# Patient Record
Sex: Female | Born: 1975 | Race: White | Hispanic: No | Marital: Married | Smoking: Never smoker
Health system: Southern US, Community
[De-identification: ages and names within clinical notes are randomized; demographics above are authoritative.]

## PROBLEM LIST (undated history)

## (undated) DIAGNOSIS — I10 Essential (primary) hypertension: Secondary | ICD-10-CM

## (undated) DIAGNOSIS — Z789 Other specified health status: Secondary | ICD-10-CM

## (undated) DIAGNOSIS — F32A Depression, unspecified: Secondary | ICD-10-CM

## (undated) DIAGNOSIS — K219 Gastro-esophageal reflux disease without esophagitis: Secondary | ICD-10-CM

## (undated) DIAGNOSIS — F419 Anxiety disorder, unspecified: Secondary | ICD-10-CM

## (undated) DIAGNOSIS — M549 Dorsalgia, unspecified: Secondary | ICD-10-CM

## (undated) HISTORY — DX: Essential (primary) hypertension: I10

## (undated) HISTORY — DX: Anxiety disorder, unspecified: F41.9

## (undated) HISTORY — DX: Gastro-esophageal reflux disease without esophagitis: K21.9

## (undated) HISTORY — DX: Dorsalgia, unspecified: M54.9

## (undated) HISTORY — DX: Depression, unspecified: F32.A

## (undated) HISTORY — PX: BREAST SURGERY: SHX581

## (undated) HISTORY — PX: DILATION AND CURETTAGE OF UTERUS: SHX78

---

## 2010-01-06 ENCOUNTER — Ambulatory Visit (HOSPITAL_COMMUNITY): Admission: RE | Admit: 2010-01-06 | Discharge: 2010-01-06 | Payer: Self-pay | Admitting: Obstetrics and Gynecology

## 2010-01-07 ENCOUNTER — Ambulatory Visit (HOSPITAL_COMMUNITY)
Admission: RE | Admit: 2010-01-07 | Discharge: 2010-01-07 | Payer: Self-pay | Source: Home / Self Care | Admitting: Obstetrics and Gynecology

## 2010-04-05 NOTE — L&D Delivery Note (Signed)
Second Stage complicated by rather significant decels with contractions and limited recovery so VE applied at +3/OA and delivery facilitated.  Delivered an 8 pound female with Apgars of 9/9 over a second degree tear.  Nuchal cord X 1, easily reduced.  Private Cord Blood Donor (patient request).  Placenta delivered intact.  2A/1V.  Second degree tear and several small lateral tears repaired with 3-0 Rapide and 3-0 Vicryl.  Breast feed.

## 2010-04-27 ENCOUNTER — Other Ambulatory Visit: Payer: Self-pay | Admitting: Obstetrics and Gynecology

## 2010-05-28 LAB — HIV ANTIBODY (ROUTINE TESTING W REFLEX): HIV: NONREACTIVE

## 2010-05-28 LAB — HEPATITIS B SURFACE ANTIGEN: Hepatitis B Surface Ag: NEGATIVE

## 2010-05-28 LAB — ANTIBODY SCREEN: Antibody Screen: NEGATIVE

## 2010-06-18 LAB — CBC
MCV: 92.8 fL (ref 78.0–100.0)
Platelets: 257 10*3/uL (ref 150–400)
RDW: 12.9 % (ref 11.5–15.5)
WBC: 9.5 10*3/uL (ref 4.0–10.5)

## 2010-06-18 LAB — ABO/RH: ABO/RH(D): O POS

## 2010-12-16 ENCOUNTER — Inpatient Hospital Stay (HOSPITAL_COMMUNITY): Payer: Managed Care, Other (non HMO) | Admitting: Anesthesiology

## 2010-12-16 ENCOUNTER — Encounter (HOSPITAL_COMMUNITY): Payer: Self-pay | Admitting: *Deleted

## 2010-12-16 ENCOUNTER — Inpatient Hospital Stay (HOSPITAL_COMMUNITY)
Admission: AD | Admit: 2010-12-16 | Discharge: 2010-12-18 | DRG: 775 | Disposition: A | Discharge status: Home / Self Care | Payer: Managed Care, Other (non HMO) | Source: Ambulatory Visit | Attending: Obstetrics & Gynecology | Admitting: Obstetrics & Gynecology

## 2010-12-16 ENCOUNTER — Encounter (HOSPITAL_COMMUNITY): Payer: Self-pay | Admitting: Anesthesiology

## 2010-12-16 DIAGNOSIS — Z3480 Encounter for supervision of other normal pregnancy, unspecified trimester: Secondary | ICD-10-CM

## 2010-12-16 HISTORY — DX: Other specified health status: Z78.9

## 2010-12-16 LAB — CBC
HCT: 33.2 % — ABNORMAL LOW (ref 36.0–46.0)
Hemoglobin: 11 g/dL — ABNORMAL LOW (ref 12.0–15.0)
MCHC: 33.1 g/dL (ref 30.0–36.0)
RDW: 14.2 % (ref 11.5–15.5)
WBC: 11.6 10*3/uL — ABNORMAL HIGH (ref 4.0–10.5)

## 2010-12-16 LAB — RPR: RPR Ser Ql: NONREACTIVE

## 2010-12-16 MED ORDER — SENNOSIDES-DOCUSATE SODIUM 8.6-50 MG PO TABS
2.0000 | ORAL_TABLET | Freq: Every day | ORAL | Status: DC
  Administered 2010-12-17: 2 via ORAL

## 2010-12-16 MED ORDER — INFLUENZA VIRUS VACC SPLIT PF IM SUSP
0.5000 mL | Freq: Once | INTRAMUSCULAR | Status: AC
  Administered 2010-12-18: 0.5 mL via INTRAMUSCULAR
  Filled 2010-12-16: qty 0.5

## 2010-12-16 MED ORDER — OXYTOCIN 20 UNITS IN LACTATED RINGERS INFUSION - SIMPLE
125.0000 mL/h | Freq: Once | INTRAVENOUS | Status: AC
  Administered 2010-12-16: 1 mL/h via INTRAVENOUS

## 2010-12-16 MED ORDER — OXYCODONE-ACETAMINOPHEN 5-325 MG PO TABS
1.0000 | ORAL_TABLET | ORAL | Status: DC | PRN
  Administered 2010-12-17 (×3): 1 via ORAL
  Administered 2010-12-18: 2 via ORAL
  Administered 2010-12-18: 1 via ORAL
  Filled 2010-12-16: qty 2

## 2010-12-16 MED ORDER — OXYTOCIN BOLUS FROM INFUSION
500.0000 mL | Freq: Once | INTRAVENOUS | Status: DC
  Filled 2010-12-16: qty 500

## 2010-12-16 MED ORDER — LIDOCAINE HCL (PF) 1 % IJ SOLN
30.0000 mL | INTRAMUSCULAR | Status: DC | PRN
  Filled 2010-12-16 (×3): qty 30

## 2010-12-16 MED ORDER — ACETAMINOPHEN 325 MG PO TABS: 650.0000 mg | ORAL_TABLET | ORAL | Status: DC | PRN

## 2010-12-16 MED ORDER — SIMETHICONE 80 MG PO CHEW: 80.0000 mg | CHEWABLE_TABLET | ORAL | Status: DC | PRN

## 2010-12-16 MED ORDER — ONDANSETRON HCL 4 MG PO TABS: 4.0000 mg | ORAL_TABLET | ORAL | Status: DC | PRN

## 2010-12-16 MED ORDER — DIBUCAINE 1 % RE OINT: 1.0000 "application " | TOPICAL_OINTMENT | RECTAL | Status: DC | PRN

## 2010-12-16 MED ORDER — PHENYLEPHRINE 40 MCG/ML (10ML) SYRINGE FOR IV PUSH (FOR BLOOD PRESSURE SUPPORT)
80.0000 ug | PREFILLED_SYRINGE | INTRAVENOUS | Status: DC | PRN
  Filled 2010-12-16 (×2): qty 5

## 2010-12-16 MED ORDER — IBUPROFEN 600 MG PO TABS
600.0000 mg | ORAL_TABLET | Freq: Four times a day (QID) | ORAL | Status: DC | PRN
  Filled 2010-12-16 (×2): qty 1

## 2010-12-16 MED ORDER — ONDANSETRON HCL 4 MG/2ML IJ SOLN: 4.0000 mg | Freq: Four times a day (QID) | INTRAMUSCULAR | Status: DC | PRN

## 2010-12-16 MED ORDER — DIPHENHYDRAMINE HCL 25 MG PO CAPS: 25.0000 mg | ORAL_CAPSULE | Freq: Four times a day (QID) | ORAL | Status: DC | PRN

## 2010-12-16 MED ORDER — BUTORPHANOL TARTRATE 2 MG/ML IJ SOLN: 1.0000 mg | INTRAMUSCULAR | Status: DC | PRN

## 2010-12-16 MED ORDER — BENZOCAINE-MENTHOL 20-0.5 % EX AERO: 1.0000 "application " | INHALATION_SPRAY | CUTANEOUS | Status: DC | PRN

## 2010-12-16 MED ORDER — OXYCODONE-ACETAMINOPHEN 5-325 MG PO TABS: 2.0000 | ORAL_TABLET | ORAL | Status: DC | PRN

## 2010-12-16 MED ORDER — LIDOCAINE HCL (PF) 1 % IJ SOLN: 30.0000 mL | INTRAMUSCULAR | Status: DC | PRN

## 2010-12-16 MED ORDER — ZOLPIDEM TARTRATE 5 MG PO TABS: 5.0000 mg | ORAL_TABLET | Freq: Every evening | ORAL | Status: DC | PRN

## 2010-12-16 MED ORDER — TERBUTALINE SULFATE 1 MG/ML IJ SOLN: 0.2500 mg | Freq: Once | INTRAMUSCULAR | Status: AC | PRN

## 2010-12-16 MED ORDER — LACTATED RINGERS IV SOLN: 500.0000 mL | Freq: Once | INTRAVENOUS | Status: DC

## 2010-12-16 MED ORDER — OXYTOCIN 20 UNITS IN LACTATED RINGERS INFUSION - SIMPLE: 125.0000 mL/h | Freq: Once | INTRAVENOUS | Status: DC

## 2010-12-16 MED ORDER — CITRIC ACID-SODIUM CITRATE 334-500 MG/5ML PO SOLN: 30.0000 mL | ORAL | Status: DC | PRN

## 2010-12-16 MED ORDER — PRENATAL PLUS 27-1 MG PO TABS
1.0000 | ORAL_TABLET | Freq: Every day | ORAL | Status: DC
  Administered 2010-12-17 – 2010-12-18 (×2): 1 via ORAL
  Filled 2010-12-16 (×2): qty 1

## 2010-12-16 MED ORDER — IBUPROFEN 600 MG PO TABS: 600.0000 mg | ORAL_TABLET | Freq: Four times a day (QID) | ORAL | Status: DC | PRN

## 2010-12-16 MED ORDER — OXYTOCIN 20 UNITS IN LACTATED RINGERS INFUSION - SIMPLE
1.0000 m[IU]/min | INTRAVENOUS | Status: DC
Start: 2010-12-16 — End: 2010-12-18
  Administered 2010-12-16: 6 m[IU]/min via INTRAVENOUS

## 2010-12-16 MED ORDER — TETANUS-DIPHTH-ACELL PERTUSSIS 5-2.5-18.5 LF-MCG/0.5 IM SUSP
0.5000 mL | Freq: Once | INTRAMUSCULAR | Status: AC
  Administered 2010-12-17: 0.5 mL via INTRAMUSCULAR

## 2010-12-16 MED ORDER — EPHEDRINE 5 MG/ML INJ
10.0000 mg | INTRAVENOUS | Status: DC | PRN
  Filled 2010-12-16 (×2): qty 4

## 2010-12-16 MED ORDER — ONDANSETRON HCL 4 MG/2ML IJ SOLN: 4.0000 mg | INTRAMUSCULAR | Status: DC | PRN

## 2010-12-16 MED ORDER — LACTATED RINGERS IV SOLN: 500.0000 mL | INTRAVENOUS | Status: DC | PRN

## 2010-12-16 MED ORDER — LACTATED RINGERS IV SOLN
INTRAVENOUS | Status: DC
  Administered 2010-12-16 (×2): via INTRAVENOUS

## 2010-12-16 MED ORDER — FENTANYL 2.5 MCG/ML BUPIVACAINE 1/10 % EPIDURAL INFUSION (WH - ANES)
14.0000 mL/h | INTRAMUSCULAR | Status: DC
  Administered 2010-12-16 (×2): 14 mL/h via EPIDURAL
  Filled 2010-12-16 (×2): qty 60

## 2010-12-16 MED ORDER — FLEET ENEMA 7-19 GM/118ML RE ENEM: 1.0000 | ENEMA | RECTAL | Status: DC | PRN

## 2010-12-16 MED ORDER — LACTATED RINGERS IV SOLN: INTRAVENOUS | Status: DC

## 2010-12-16 MED ORDER — PHENYLEPHRINE 40 MCG/ML (10ML) SYRINGE FOR IV PUSH (FOR BLOOD PRESSURE SUPPORT)
80.0000 ug | PREFILLED_SYRINGE | INTRAVENOUS | Status: DC | PRN
  Filled 2010-12-16: qty 5

## 2010-12-16 MED ORDER — DIPHENHYDRAMINE HCL 50 MG/ML IJ SOLN: 12.5000 mg | INTRAMUSCULAR | Status: DC | PRN

## 2010-12-16 MED ORDER — LANOLIN HYDROUS EX OINT: TOPICAL_OINTMENT | CUTANEOUS | Status: DC | PRN

## 2010-12-16 MED ORDER — EPHEDRINE 5 MG/ML INJ
10.0000 mg | INTRAVENOUS | Status: DC | PRN
  Filled 2010-12-16: qty 4

## 2010-12-16 MED ORDER — LACTATED RINGERS IV SOLN
500.0000 mL | INTRAVENOUS | Status: DC | PRN
  Administered 2010-12-16 (×3): 1000 mL via INTRAVENOUS

## 2010-12-16 MED ORDER — LIDOCAINE HCL 1.5 % IJ SOLN
INTRAMUSCULAR | Status: DC | PRN
  Administered 2010-12-16 (×2): 5 mL via EPIDURAL
  Administered 2010-12-16: 2 mL via EPIDURAL

## 2010-12-16 MED ORDER — OXYTOCIN BOLUS FROM INFUSION
500.0000 mL | Freq: Once | INTRAVENOUS | Status: AC
  Administered 2010-12-16: 500 mL via INTRAVENOUS
  Filled 2010-12-16: qty 500
  Filled 2010-12-16: qty 1000

## 2010-12-16 MED ORDER — IBUPROFEN 600 MG PO TABS
600.0000 mg | ORAL_TABLET | Freq: Four times a day (QID) | ORAL | Status: DC
  Administered 2010-12-17 – 2010-12-18 (×6): 600 mg via ORAL
  Filled 2010-12-16 (×4): qty 1

## 2010-12-16 MED ORDER — OXYCODONE-ACETAMINOPHEN 5-325 MG PO TABS
2.0000 | ORAL_TABLET | ORAL | Status: DC | PRN
  Filled 2010-12-16 (×4): qty 1

## 2010-12-16 MED ORDER — WITCH HAZEL-GLYCERIN EX PADS: 1.0000 "application " | MEDICATED_PAD | CUTANEOUS | Status: DC | PRN

## 2010-12-16 NOTE — Progress Notes (Signed)
Pt up to ambulate at this time.

## 2010-12-16 NOTE — Progress Notes (Signed)
30- dr. Aldona Bar viewed strip. Aware that pitocin has been dc'd.

## 2010-12-16 NOTE — Progress Notes (Signed)
42- dr. Aldona Bar called concerning decels. Notified of pt's sve and position change.

## 2010-12-16 NOTE — Anesthesia Procedure Notes (Signed)
Epidural Patient location during procedure: OB Start time: 12/16/2010 2:46 PM Reason for block: procedure for pain  Staffing Performed by: anesthesiologist   Preanesthetic Checklist Completed: patient identified, site marked, surgical consent, pre-op evaluation, timeout performed, IV checked, risks and benefits discussed and monitors and equipment checked  Epidural Patient position: sitting Prep: site prepped and draped and DuraPrep Patient monitoring: continuous pulse ox and blood pressure Approach: midline Injection technique: LOR air  Needle:  Needle type: Tuohy  Needle gauge: 17 G Needle length: 9 cm Needle insertion depth: 4 cm Catheter type: closed end flexible Catheter size: 19 Gauge Catheter at skin depth: 9 cm Test dose: negative  Assessment Events: blood not aspirated, injection not painful, no injection resistance, negative IV test and no paresthesia

## 2010-12-16 NOTE — Progress Notes (Signed)
FH reassuring/reactive.  Comfortable with epidural.  Cx: 4/80/vtx-2-1 and sl posterior.  AROM--clear fluid.  Hopefully this will result in progress.Marland KitchenMarland Kitchen

## 2010-12-16 NOTE — Progress Notes (Signed)
Pt G2 P1 at 40.3 having contractions.  Reports bloody show, denies any problems with pregnancy.

## 2010-12-16 NOTE — Progress Notes (Signed)
Spaced out contractions; FH reactive.  Although orders for [facility] Aug, etc put in at 8:30AM, L&D unaware.  Recommunicated with patient's nurse.  Pit aug to be begun soon.

## 2010-12-16 NOTE — Progress Notes (Signed)
Head compression decelerations noted with better contraction pattern.  Nurse has placed patient on oxygen.  In between contractions, FH reassuring.  Cx now 5/90/ more anterior and probably down to -1 to 0 station.  Suspect she may quickly progress now.

## 2010-12-16 NOTE — Progress Notes (Signed)
Dr. Arlyce Dice notified of pt presenting of labor check. Notified of ctx pattern.  Orders to check pt.  Call back if in active labor, if not pt may walk and be rechecked after one hour.

## 2010-12-16 NOTE — Progress Notes (Signed)
Nice progress--now 6-7/90+/vtx 0 +1.  Continues with head compression decels and patient is feeling pressure with contractions.

## 2010-12-16 NOTE — H&P (Signed)
35 year old G3P1A1 at 40+ weeks gestation with benign pregnancy in early labor.  -GBS.    PE:  VS stable Abd:  FH reactive.  EFW about 7-8 pounds. CX:  2+ still slightly posterior/Vtx -2  IMP:  Term IUP Plan:  Admit/ Augment with Pit (discussed with patient and husband)/expectant management

## 2010-12-16 NOTE — Progress Notes (Signed)
1930- pt c/o urge to push with uc's.

## 2010-12-16 NOTE — Anesthesia Preprocedure Evaluation (Signed)
Anesthesia Evaluation  Name, MR# and DOB Patient awake  General Assessment Comment  Reviewed: Allergy & Precautions, H&P , NPO status , Patient's Chart, lab work & pertinent test results, reviewed documented beta blocker date and time   History of Anesthesia Complications Negative for: history of anesthetic complications  Airway Mallampati: I TM Distance: >3 FB Neck ROM: full    Dental  (+) Teeth Intact   Pulmonary  clear to auscultation  breath sounds clear to auscultation none    Cardiovascular regular Normal    Neuro/Psych Negative Neurological ROS  Negative Psych ROS  GI/Hepatic/Renal negative GI ROS  negative Liver ROS  negative Renal ROS        Endo/Other  Negative Endocrine ROS (+)      Abdominal   Musculoskeletal   Hematology negative hematology ROS (+)   Peds  Reproductive/Obstetrics (+) Pregnancy    Anesthesia Other Findings             Anesthesia Physical Anesthesia Plan  ASA: II  Anesthesia Plan: Epidural   Post-op Pain Management:    Induction:   Airway Management Planned:   Additional Equipment:   Intra-op Plan:   Post-operative Plan:   Informed Consent: I have reviewed the patients History and Physical, chart, labs and discussed the procedure including the risks, benefits and alternatives for the proposed anesthesia with the patient or authorized representative who has indicated his/her understanding and acceptance.     Plan Discussed with:   Anesthesia Plan Comments:         Anesthesia Quick Evaluation  

## 2010-12-16 NOTE — Progress Notes (Signed)
Notified of sve. Order to admit  To L&D

## 2010-12-17 LAB — CBC
Platelets: 208 10*3/uL (ref 150–400)
RBC: 3.54 MIL/uL — ABNORMAL LOW (ref 3.87–5.11)
WBC: 13.2 10*3/uL — ABNORMAL HIGH (ref 4.0–10.5)

## 2010-12-17 NOTE — Progress Notes (Addendum)
Patient is eating, ambulating, voiding.  Pain control is good.  Filed Vitals:   12/16/10 2300 12/17/10 0026 12/17/10 0438 12/17/10 0645  BP: 124/69 109/67 120/71 125/78  Pulse: 63 67 62 71  Temp: 97.8 F (36.6 C) 98.2 F (36.8 C) 98 F (36.7 C) 97.7 F (36.5 C)  TempSrc: Oral  Oral Oral  Resp: 18 14  16   Height:      Weight:      SpO2:  98% 97%     Fundus firm Perineum without swelling.  Lab Results  Component Value Date   WBC 13.2* 12/17/2010   HGB 10.5* 12/17/2010   HCT 31.7* 12/17/2010   MCV 89.5 12/17/2010   PLT 208 12/17/2010    O/Positive/-- (02/23 0000)  A/P Post partum day 1.  Routine care.  Expect d/c per plan.   Parents desires circumsision.  All risks, benefits and alternatives discussed with the mother.   Brette Cast A

## 2010-12-18 MED ORDER — IBUPROFEN 600 MG PO TABS: 600.0000 mg | ORAL_TABLET | Freq: Four times a day (QID) | ORAL | Status: AC | PRN

## 2010-12-18 MED ORDER — OXYCODONE-ACETAMINOPHEN 5-325 MG PO TABS: 1.0000 | ORAL_TABLET | ORAL | Status: AC | PRN

## 2010-12-18 NOTE — Discharge Summary (Signed)
Obstetric Discharge Summary Reason for Admission: onset of labor Prenatal Procedures: ultrasound Intrapartum Procedures: spontaneous vaginal delivery Postpartum Procedures: none Complications-Operative and Postpartum: none Hemoglobin  Date Value Range Status  12/17/2010 10.5* 12.0-15.0 (g/dL) Final     HCT  Date Value Range Status  12/17/2010 31.7* 36.0-46.0 (%) Final    Discharge Diagnoses: Term Pregnancy-delivered  Discharge Information: Date: 12/18/2010 Activity: unrestricted Diet: routine Medications: PNV, Ibuprophen and Percocet Condition: stable Instructions: refer to practice specific booklet Discharge to: home Follow-up Information    Follow up with Kara Hodge,Kara Hodge in 4 weeks.   Contact information:   [address] [place] [address] [phone]          Newborn Data: Live born female  Birth Weight: 8 lb 0.6 oz (3646 g) APGAR: 9, 9  Home with mother.  Kara Hodge 12/18/2010, 7:57 AM

## 2010-12-18 NOTE — Progress Notes (Signed)
PPD#2 Pt without c/o. VVSAF IMP/Doing well PLAN/ D/C to home.

## 2010-12-18 NOTE — Anesthesia Postprocedure Evaluation (Signed)
Patient stable following vaginal delivery.  

## 2010-12-18 NOTE — Addendum Note (Signed)
Addendum  created 12/18/10 0809 by Stevee Valenta L. Rodman Pickle   Modules edited:Notes Section

## 2010-12-19 ENCOUNTER — Inpatient Hospital Stay (HOSPITAL_COMMUNITY): Admission: RE | Admit: 2010-12-19 | Payer: Self-pay | Source: Ambulatory Visit

## 2010-12-23 NOTE — Progress Notes (Signed)
Encounter addended by: Elaina Hoops, RN on: 12/23/2010 10:21 PM<BR>     Documentation filed: Charges VN

## 2011-06-28 ENCOUNTER — Ambulatory Visit (HOSPITAL_BASED_OUTPATIENT_CLINIC_OR_DEPARTMENT_OTHER)
Admission: RE | Admit: 2011-06-28 | Discharge: 2011-06-28 | Disposition: A | Discharge status: Home / Self Care | Payer: Managed Care, Other (non HMO) | Source: Ambulatory Visit | Attending: Family Medicine | Admitting: Family Medicine

## 2011-06-28 ENCOUNTER — Other Ambulatory Visit (HOSPITAL_BASED_OUTPATIENT_CLINIC_OR_DEPARTMENT_OTHER): Payer: Self-pay | Admitting: Family Medicine

## 2011-06-28 DIAGNOSIS — S6992XA Unspecified injury of left wrist, hand and finger(s), initial encounter: Secondary | ICD-10-CM

## 2011-06-28 DIAGNOSIS — S62639A Displaced fracture of distal phalanx of unspecified finger, initial encounter for closed fracture: Secondary | ICD-10-CM | POA: Insufficient documentation

## 2011-06-28 DIAGNOSIS — X58XXXA Exposure to other specified factors, initial encounter: Secondary | ICD-10-CM | POA: Insufficient documentation

## 2011-08-03 ENCOUNTER — Other Ambulatory Visit (HOSPITAL_BASED_OUTPATIENT_CLINIC_OR_DEPARTMENT_OTHER): Payer: Self-pay | Admitting: Family Medicine

## 2011-08-03 ENCOUNTER — Ambulatory Visit (HOSPITAL_BASED_OUTPATIENT_CLINIC_OR_DEPARTMENT_OTHER)
Admission: RE | Admit: 2011-08-03 | Discharge: 2011-08-03 | Disposition: A | Discharge status: Home / Self Care | Payer: Managed Care, Other (non HMO) | Source: Ambulatory Visit | Attending: Family Medicine | Admitting: Family Medicine

## 2011-08-03 DIAGNOSIS — S62639A Displaced fracture of distal phalanx of unspecified finger, initial encounter for closed fracture: Secondary | ICD-10-CM | POA: Insufficient documentation

## 2011-08-03 DIAGNOSIS — T148XXA Other injury of unspecified body region, initial encounter: Secondary | ICD-10-CM

## 2011-08-03 DIAGNOSIS — Z09 Encounter for follow-up examination after completed treatment for conditions other than malignant neoplasm: Secondary | ICD-10-CM

## 2011-08-03 DIAGNOSIS — X58XXXA Exposure to other specified factors, initial encounter: Secondary | ICD-10-CM | POA: Insufficient documentation

## 2011-09-23 ENCOUNTER — Other Ambulatory Visit (HOSPITAL_BASED_OUTPATIENT_CLINIC_OR_DEPARTMENT_OTHER): Payer: Self-pay | Admitting: Family Medicine

## 2011-09-23 ENCOUNTER — Ambulatory Visit (HOSPITAL_BASED_OUTPATIENT_CLINIC_OR_DEPARTMENT_OTHER)
Admission: RE | Admit: 2011-09-23 | Discharge: 2011-09-23 | Disposition: A | Discharge status: Home / Self Care | Payer: Managed Care, Other (non HMO) | Source: Ambulatory Visit | Attending: Family Medicine | Admitting: Family Medicine

## 2011-09-23 DIAGNOSIS — M25549 Pain in joints of unspecified hand: Secondary | ICD-10-CM | POA: Insufficient documentation

## 2012-02-17 ENCOUNTER — Other Ambulatory Visit: Payer: Self-pay | Admitting: Obstetrics and Gynecology

## 2013-03-19 ENCOUNTER — Other Ambulatory Visit: Payer: Self-pay | Admitting: Obstetrics and Gynecology

## 2013-12-15 DIAGNOSIS — G4733 Obstructive sleep apnea (adult) (pediatric): Secondary | ICD-10-CM | POA: Insufficient documentation

## 2013-12-15 DIAGNOSIS — E038 Other specified hypothyroidism: Secondary | ICD-10-CM | POA: Insufficient documentation

## 2014-02-04 ENCOUNTER — Encounter (HOSPITAL_COMMUNITY): Payer: Self-pay | Admitting: *Deleted

## 2014-03-20 ENCOUNTER — Other Ambulatory Visit: Payer: Self-pay | Admitting: Obstetrics and Gynecology

## 2014-03-22 LAB — CYTOLOGY - PAP

## 2015-01-12 DIAGNOSIS — E66811 Obesity, class 1: Secondary | ICD-10-CM | POA: Insufficient documentation

## 2015-01-12 DIAGNOSIS — E669 Obesity, unspecified: Secondary | ICD-10-CM | POA: Insufficient documentation

## 2015-01-12 DIAGNOSIS — R632 Polyphagia: Secondary | ICD-10-CM | POA: Insufficient documentation

## 2015-04-08 ENCOUNTER — Other Ambulatory Visit: Payer: Self-pay | Admitting: Obstetrics and Gynecology

## 2015-04-09 LAB — CYTOLOGY - PAP

## 2016-01-07 ENCOUNTER — Other Ambulatory Visit: Payer: Self-pay | Admitting: Obstetrics and Gynecology

## 2016-01-07 DIAGNOSIS — N63 Unspecified lump in unspecified breast: Secondary | ICD-10-CM

## 2016-01-14 ENCOUNTER — Other Ambulatory Visit: Payer: Self-pay | Admitting: Obstetrics and Gynecology

## 2016-01-14 ENCOUNTER — Ambulatory Visit
Admission: RE | Admit: 2016-01-14 | Discharge: 2016-01-14 | Disposition: A | Discharge status: Home / Self Care | Payer: Managed Care, Other (non HMO) | Source: Ambulatory Visit | Attending: Obstetrics and Gynecology | Admitting: Obstetrics and Gynecology

## 2016-01-14 DIAGNOSIS — N63 Unspecified lump in unspecified breast: Secondary | ICD-10-CM

## 2016-04-12 ENCOUNTER — Other Ambulatory Visit: Payer: Self-pay | Admitting: Obstetrics and Gynecology

## 2016-04-13 LAB — CYTOLOGY - PAP

## 2017-12-09 DIAGNOSIS — F32A Depression, unspecified: Secondary | ICD-10-CM | POA: Insufficient documentation

## 2017-12-09 DIAGNOSIS — F329 Major depressive disorder, single episode, unspecified: Secondary | ICD-10-CM | POA: Insufficient documentation

## 2018-02-22 IMAGING — MG 2D DIGITAL DIAGNOSTIC BILATERAL MAMMOGRAM WITH IMPLANTS, CAD AND
8 of 19 series · 8 of 39 positions shown · non-contrast
Comparison: No prior exams.

CLINICAL DATA: Patient describes a palpable lump within the inner
left breast. Patient also describes diffuse pain within the
upper-outer quadrant of the left breast and nipple sensitivity.

EXAM:
2D DIGITAL DIAGNOSTIC BILATERAL MAMMOGRAM WITH IMPLANTS, CAD AND
ADJUNCT TOMO
ULTRASOUND LEFT BREAST
The patient has retropectoral saline implants. Standard and implant
displaced views were performed.

[L MLO]
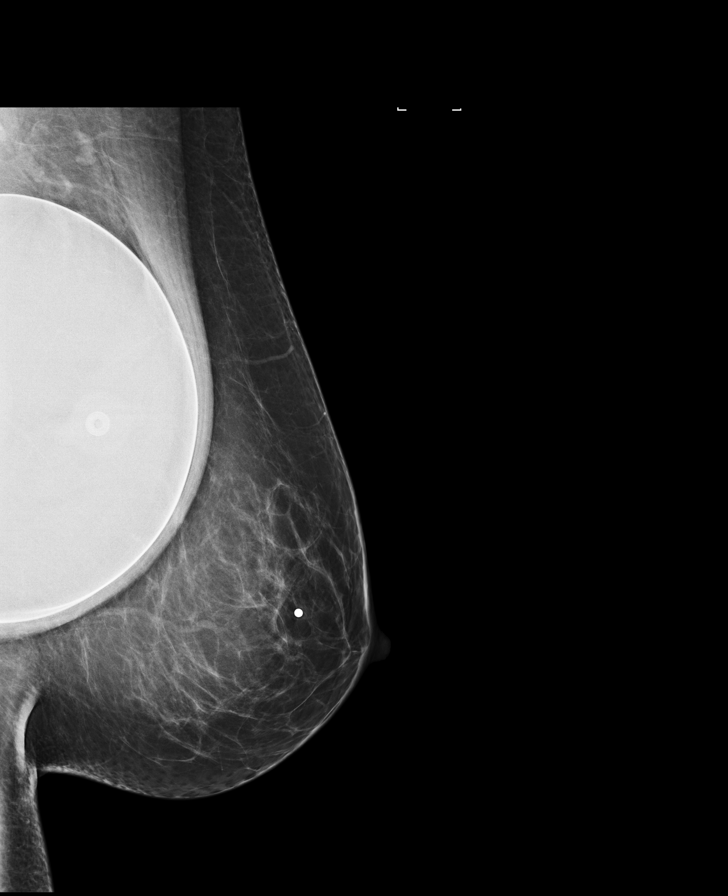

[R CC (1 of 2)]
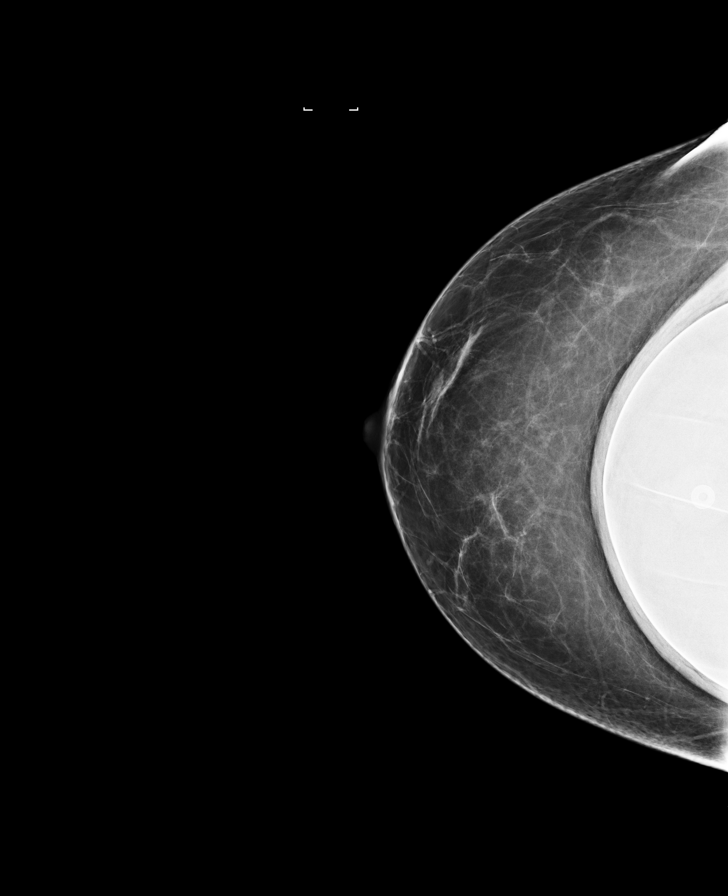

[L CC (1 of 2)]
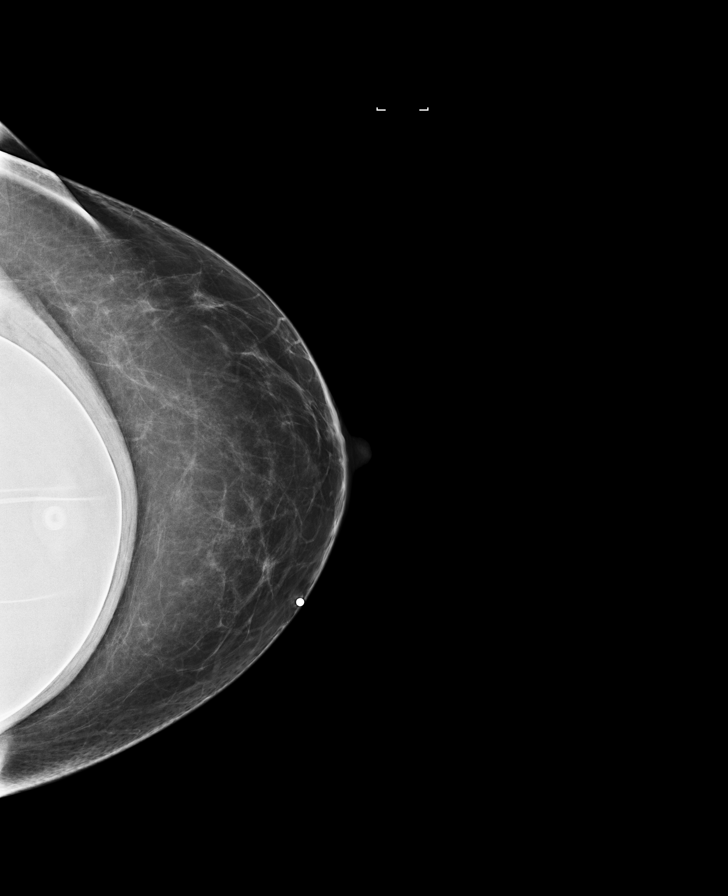

[R MLO]
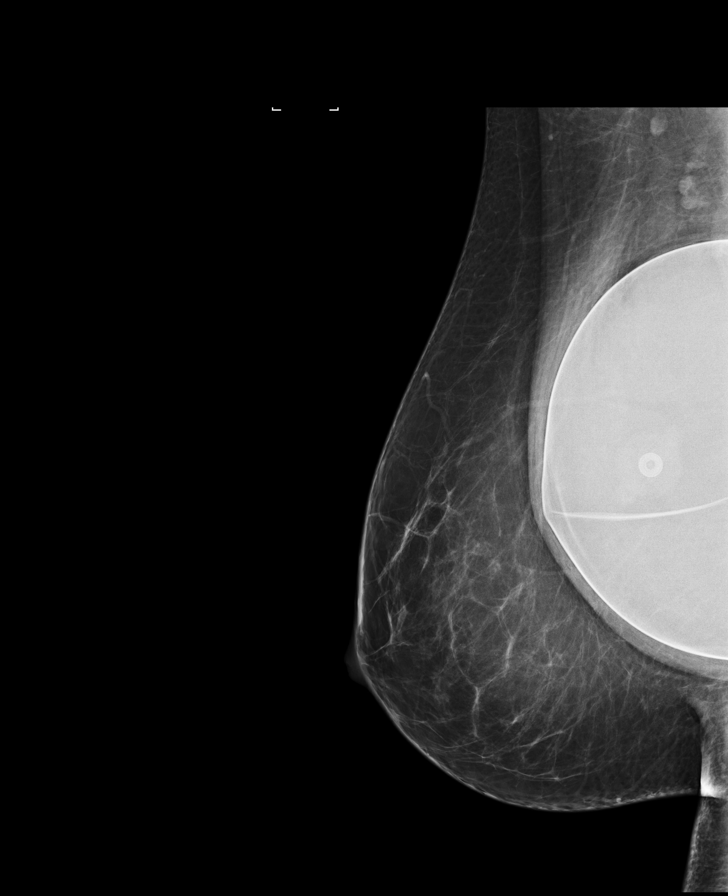

[R CC (2 of 2)]
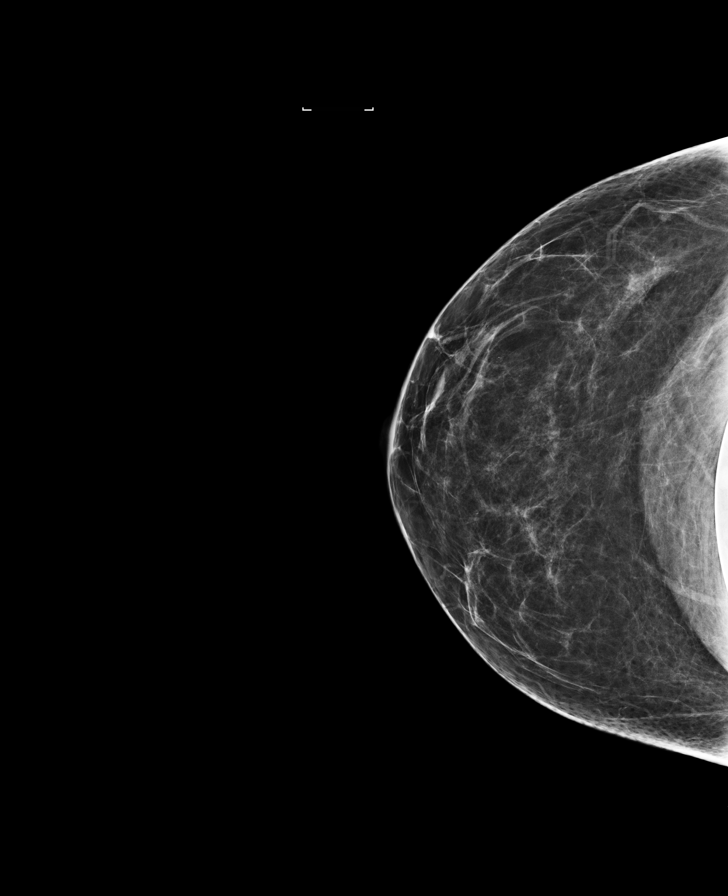

[L MLO synth-2D]
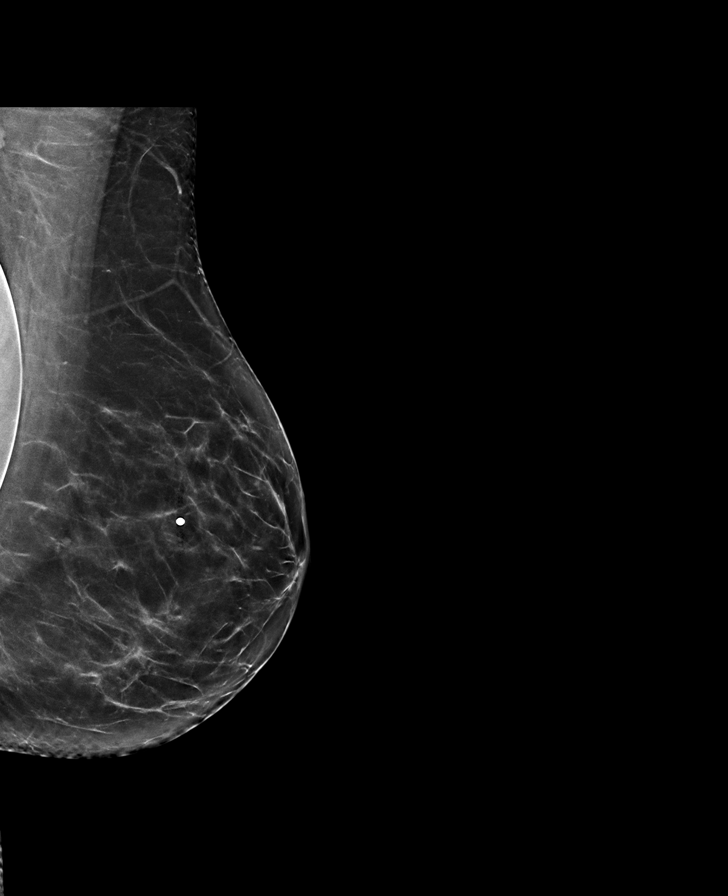

[L CC (2 of 2)]
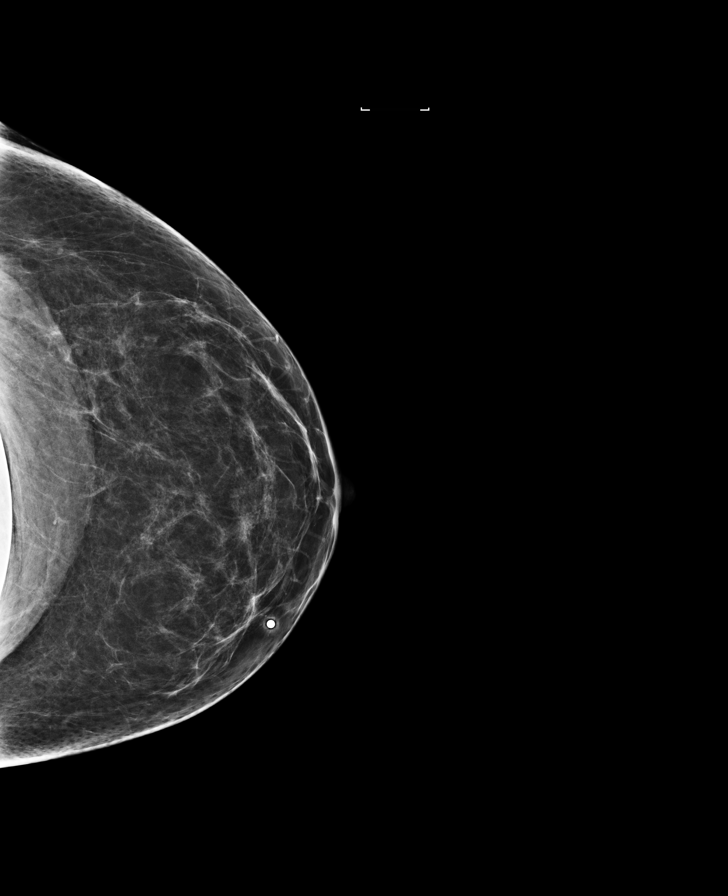

[R MLO synth-2D]
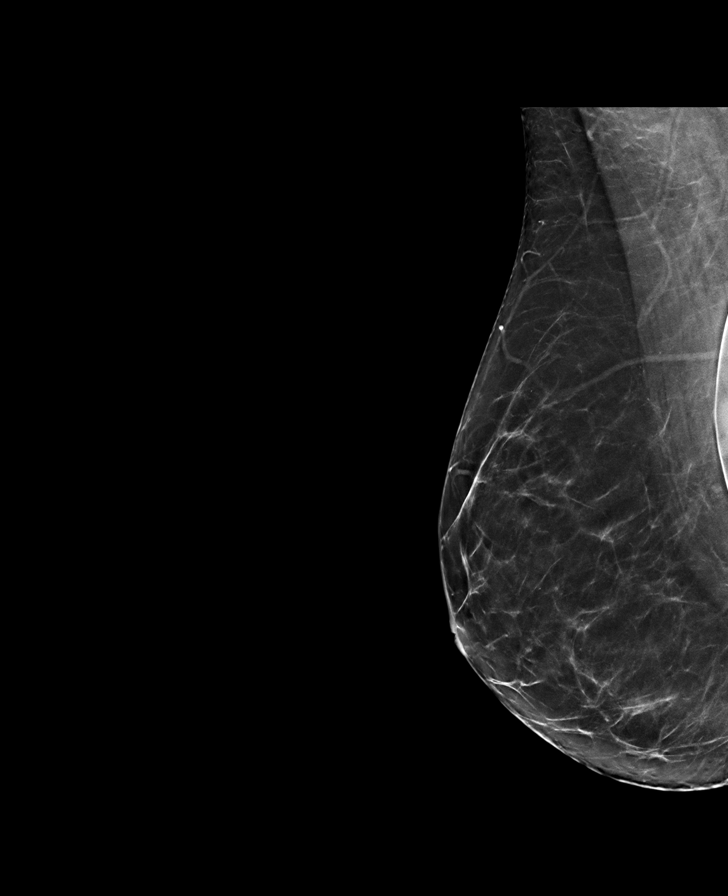

[8 of 39 positions shown; findings below may reference images not displayed]

ACR Breast Density Category b: There are scattered areas of
fibroglandular density.
FINDINGS: Bilateral 2D CC and MLO projections were obtained, with additional
3D tomosynthesis, and with additional tangential spot compression
views of the inner left breast corresponding to the area of clinical
concern.

There are no dominant masses, suspicious calcifications or secondary
signs of malignancy within either breast.

Specifically, there is no mammographic abnormality within the left
breast corresponding to the areas of clinical concern.

Targeted ultrasound is performed, evaluating the entire inner left
breast with particular attention to the 10 o'clock axis region
corresponding to the area of patient's pain as directed by the
patient, showing only normal fibroglandular tissues and fat lobules
throughout. No suspicious solid or cystic masses are identified.

Mammographic images were processed with CAD.
IMPRESSION: No evidence of malignancy within either breast. No mammographic or
sonographic evidence of malignancy within the inner left breast
corresponding to the area of clinical concern.

RECOMMENDATION:
Screening mammogram at age 41 unless there are persistent or
intervening clinical concerns. (Code:RZ-6-A6X)

Benign causes of breast pain, and possible remedies, were discussed
with the patient. Patient was encouraged to follow-up with referring
physician if pain became localized and persistent or if a palpable
lump/mass developed in the area of pain.

I have discussed the findings and recommendations with the patient.
Results were also provided in writing at the conclusion of the
visit. If applicable, a reminder letter will be sent to the patient
regarding the next appointment.

BI-RADS CATEGORY  1: Negative.

## 2019-04-09 ENCOUNTER — Encounter: Payer: Self-pay | Admitting: Cardiology

## 2019-04-09 ENCOUNTER — Ambulatory Visit (INDEPENDENT_AMBULATORY_CARE_PROVIDER_SITE_OTHER): Payer: Commercial Managed Care - PPO | Admitting: Cardiology

## 2019-04-09 ENCOUNTER — Other Ambulatory Visit: Payer: Self-pay

## 2019-04-09 VITALS — BP 114/70 | HR 69 | Ht 67.0 in | Wt 226.0 lb

## 2019-04-09 DIAGNOSIS — R002 Palpitations: Secondary | ICD-10-CM

## 2019-04-09 DIAGNOSIS — R0789 Other chest pain: Secondary | ICD-10-CM | POA: Diagnosis not present

## 2019-04-09 DIAGNOSIS — Z8249 Family history of ischemic heart disease and other diseases of the circulatory system: Secondary | ICD-10-CM

## 2019-04-09 DIAGNOSIS — E781 Pure hyperglyceridemia: Secondary | ICD-10-CM | POA: Diagnosis not present

## 2019-04-09 DIAGNOSIS — Z7189 Other specified counseling: Secondary | ICD-10-CM

## 2019-04-09 LAB — LIPID PANEL
Chol/HDL Ratio: 3.7 ratio (ref 0.0–4.4)
Cholesterol, Total: 234 mg/dL — ABNORMAL HIGH (ref 100–199)
HDL: 64 mg/dL (ref >39)
LDL Chol Calc (NIH): 126 mg/dL — ABNORMAL HIGH (ref 0–99)
Triglycerides: 249 mg/dL — ABNORMAL HIGH (ref 0–149)
VLDL Cholesterol Cal: 44 mg/dL — ABNORMAL HIGH (ref 5–40)

## 2019-04-09 NOTE — Patient Instructions (Signed)
Medication Instructions:  Your Physician recommend you continue on your current medication as directed.    *If you need a refill on your cardiac medications before your next appointment, please call your pharmacy*  Lab Work: Your physician recommends that you return for lab work today (lipid).  If you have labs (blood work) drawn today and your tests are completely normal, you will receive your results only by: Marland Kitchen MyChart Message (if you have MyChart) OR . A paper copy in the mail If you have any lab test that is abnormal or we need to change your treatment, we will call you to review the results.  Testing/Procedures: None  Follow-Up: At [facility], you and your health needs are our priority.  As part of our continuing mission to provide you with exceptional heart care, we have created designated Provider Care Teams.  These Care Teams include your primary Cardiologist (physician) and Advanced Practice Providers (APPs -  Physician Assistants and Nurse Practitioners) who all work together to provide you with the care you need, when you need it.  Your next appointment:   As needed   The format for your next appointment:   Either In Person or Virtual  Provider:   Jodelle Red, MD

## 2019-04-09 NOTE — Progress Notes (Signed)
Cardiology Office Note:    Date:  04/09/2019   ID:  Kara Hodge, DOB [birth year], MRN [id number]  PCP:  Shon Hale, MD  Cardiologist:  Jodelle Red, MD  Referring MD: Shon Hale, *   CC: new patient consultation for evaluation of palpitations and chest pain  History of Present Illness:    Kara Hodge is a 44 y.o. female with a hx of anxiety who is seen as a new consult at the request of Shon Hale, * for the evaluation and management of palpitations and chest pain.  Chest pain/palpitations: -Initial onset: about a month after starting wellbutrin, started having chest pain and palpitations. Stopped taking it, and symptoms improved. Started 01/2019, stopped 02/2019 and symptoms improved about a week later.  Does have some chronic chest pain over upper left chest and upper left shoulderblade. Worse with movement, tender to palpation. Always in the same place, no radiation, no associated symptoms. Pain has been chronic for many years. -Frequency/Duration: palpitations seem to be gone. Chest pain remains. -Associated symptoms: none other than listed -Aggravating/alleviating factors: chest pain better with ibuprofen, better with alcohol -Syncope/near syncope: none -Prior cardiac history: none -Prior ECG: none available to me before today, but has had 2 ECGs prior at urgent care -Prior workup: none -Prior treatment: none -Possible medication interactions: prior--wellbutrin, current--fluoxetine since 2010 -Caffeine: 2-3 cups coffee/day -Alcohol: no more than 7 drinks/week -Tobacco: briefly in college -OTC supplements: magnesium, B12, calcium -Comorbidities: BMI 35. No history of preeclampsia or gestational diabetes. -Exercise level: aims to exercises 30-60 min/day, walk, run, or lift weights. -Labs: TSH, kidney function/electrolytes, CBC reviewed per [facility]. -Cardiac ROS: no shortness of breath with rest or normal exertion, + PND a few times/week  when sleeping on her back, no orthopnea, no LE edema. +snoring. -Family history: MGM had CAD (no stents/bypass surgery that she knows of), multiple strokes, last at age 44. Mother has hypertension. MGF died of MI in his 6s, age 69. Sister (age 60) has HTN.  Past Medical History:  Diagnosis Date  . No pertinent past medical history     Past Surgical History:  Procedure Laterality Date  . BREAST SURGERY    . DILATION AND CURETTAGE OF UTERUS      Current Medications: Current Outpatient Medications on File Prior to Visit  Medication Sig  . calcium carbonate (OS-CAL) 600 MG tablet Take 600 mg by mouth 2 (two) times daily with a meal.  . FLUoxetine (PROZAC) 20 MG capsule Take 20 mg by mouth daily.  . magnesium oxide (MAG-OX) 400 MG tablet Take 400 mg by mouth daily.  . vitamin B-12 (CYANOCOBALAMIN) 1000 MCG tablet Take 1,000 mcg by mouth daily.   No current facility-administered medications on file prior to visit.     Allergies:   Patient has no known allergies.   Social History   Tobacco Use  . Smoking status: Never Smoker  . Smokeless tobacco: Never Used  Substance Use Topics  . Alcohol use: No  . Drug use: No    Family History: family history is not on file.  ROS:   Please see the history of present illness.  Additional pertinent ROS: Constitutional: Negative for chills, fever, night sweats, unintentional weight loss  HENT: Negative for ear pain and hearing loss.   Eyes: Negative for loss of vision and eye pain.  Respiratory: Negative for cough, sputum, wheezing.   Cardiovascular: See HPI. Gastrointestinal: Negative for abdominal pain, melena, and hematochezia.  Genitourinary: Negative for dysuria  and hematuria.  Musculoskeletal: Negative for falls and myalgias.  Skin: Negative for itching and rash.  Neurological: Negative for focal weakness, focal sensory changes and loss of consciousness.  Endo/Heme/Allergies: Does not bruise/bleed easily.     EKGs/Labs/Other  Studies Reviewed:    The following studies were reviewed today: No prior cardiac studies  EKG:  EKG is personally reviewed.  The ekg ordered today demonstrates sinus rhythm with sinus arrhythmia  Recent Labs: No results found for requested labs within last 8760 hours.  Recent Lipid Panel    Component Value Date/Time   CHOL 234 (H) 04/09/2019 0943   TRIG 249 (H) 04/09/2019 0943   HDL 64 04/09/2019 0943   CHOLHDL 3.7 04/09/2019 0943   LDLCALC 126 (H) 04/09/2019 0943    Physical Exam:    VS:  BP 114/70   Pulse 69   Ht 5\' 7"  (1.702 m)   Wt 226 lb (102.5 kg)   BMI 35.40 kg/m     Wt Readings from Last 3 Encounters:  04/09/19 226 lb (102.5 kg)  12/16/10 196 lb 6.4 oz (89.1 kg)    GEN: Well nourished, well developed in no acute distress HEENT: Normal, moist mucous membranes NECK: No JVD CARDIAC: regular rhythm, normal S1 and S2, no rubs or gallops. No murmurs. VASCULAR: Radial and DP pulses 2+ bilaterally. No carotid bruits RESPIRATORY:  Clear to auscultation without rales, wheezing or rhonchi  ABDOMEN: Soft, non-tender, non-distended MUSCULOSKELETAL:  Ambulates independently SKIN: Warm and dry, no edema NEUROLOGIC:  Alert and oriented x 3. No focal neuro deficits noted. PSYCHIATRIC:  Normal affect    ASSESSMENT:    1. Palpitation   2. Family history of heart disease   3. Other chest pain   4. Cardiac risk counseling   5. Counseling on health promotion and disease prevention   6. Hypertriglyceridemia    PLAN:    Palpitations: resolved now that she has come off of welbutrin -discussed both Zio monitor and KardiaMobile today. As she is feeling better, plan to monitor her symptoms for now. If they worsen, she will call and we will discuss next steps  Atypical chest pain: nonexertional, endorses tenderness to palpation and movement. Likely MSK. However, instructed on warning signs and when to seek medical attention.  Family history of heart disease: will get screening  lipids today to assist with risk assessment. No premature CAD that she knows of.  Updated: labs show hypertriglyceridemia at 249. Will ask if she was fasting. Recommend focusing on weight loss, low carb/low saturated fat. Would recheck in several months. If still elevated, consider statin or prescription omega 3.  Cardiac risk counseling and prevention recommendations: -recommend heart healthy/Mediterranean diet, with whole grains, fruits, vegetable, fish, lean meats, nuts, and olive oil. Limit salt. -recommend moderate walking, 3-5 times/week for 30-50 minutes each session. Aim for at least 150 minutes.week. Goal should be pace of 3 miles/hours, or walking 1.5 miles in 30 minutes -recommend avoidance of tobacco products. Avoid excess alcohol. -Additional risk factor control:  -Diabetes risk: A1c is not available  -Lipids: drawing today  -Blood pressure control: at goal, on no meds  -Weight: BMI 35, working on weight loss  -does endorse snoring. If it persists, would consider sleep study -ASCVD risk score: (updated with labs) The 10-year ASCVD risk score Mikey Bussing DC Jr., et al., 2013) is: 0.5%   Values used to calculate the score:     Age: 26 years     Sex: Female     Is Non-Hispanic  African American: No     Diabetic: No     Tobacco smoker: No     Systolic Blood Pressure: 114 mmHg     Is BP treated: No     HDL Cholesterol: 64 mg/dL     Total Cholesterol: 234 mg/dL    Plan for follow up: based on lipids, would recheck fasting in about 3 mos, then follow up with office visit shortly after to discuss  Total time of encounter:  60 minutes total time of encounter, including 35 minutes spent in face-to-face patient care. This time includes coordination of care and counseling regarding palpitations, CV risk, workup/treatement. Remainder of non-face-to-face time involved reviewing chart documents/testing relevant to the patient encounter and documentation in the medical record.  Jodelle Red, MD, PhD [facility]   Medication Adjustments/Labs and Tests Ordered: Current medicines are reviewed at length with the patient today.  Concerns regarding medicines are outlined above.  Orders Placed This Encounter  Procedures  . Lipid panel  . EKG 12-Lead   No orders of the defined types were placed in this encounter.   Patient Instructions  Medication Instructions:  Your Physician recommend you continue on your current medication as directed.    *If you need a refill on your cardiac medications before your next appointment, please call your pharmacy*  Lab Work: Your physician recommends that you return for lab work today (lipid).  If you have labs (blood work) drawn today and your tests are completely normal, you will receive your results only by: Marland Kitchen MyChart Message (if you have MyChart) OR . A paper copy in the mail If you have any lab test that is abnormal or we need to change your treatment, we will call you to review the results.  Testing/Procedures: None  Follow-Up: At [facility], you and your health needs are our priority.  As part of our continuing mission to provide you with exceptional heart care, we have created designated Provider Care Teams.  These Care Teams include your primary Cardiologist (physician) and Advanced Practice Providers (APPs -  Physician Assistants and Nurse Practitioners) who all work together to provide you with the care you need, when you need it.  Your next appointment:   As needed   The format for your next appointment:   Either In Person or Virtual  Provider:   Jodelle Red, MD     Signed, Jodelle Red, MD PhD 04/09/2019 11:42 PM    [facility]

## 2019-04-10 ENCOUNTER — Telehealth: Payer: Self-pay | Admitting: Cardiology

## 2019-04-10 ENCOUNTER — Other Ambulatory Visit: Payer: Self-pay

## 2019-04-10 DIAGNOSIS — E781 Pure hyperglyceridemia: Secondary | ICD-10-CM

## 2019-04-10 NOTE — Telephone Encounter (Signed)
Patient is returning call for lab results. Please call. 

## 2019-04-10 NOTE — Telephone Encounter (Signed)
Pt updated with lab results and verbalized understanding. Repeat lab order placed.

## 2019-05-29 ENCOUNTER — Ambulatory Visit: Payer: Commercial Managed Care - PPO | Attending: Family Medicine

## 2019-05-29 ENCOUNTER — Other Ambulatory Visit: Payer: Self-pay

## 2019-05-29 DIAGNOSIS — M542 Cervicalgia: Secondary | ICD-10-CM

## 2019-05-29 DIAGNOSIS — R252 Cramp and spasm: Secondary | ICD-10-CM | POA: Diagnosis present

## 2019-05-29 DIAGNOSIS — M6281 Muscle weakness (generalized): Secondary | ICD-10-CM | POA: Diagnosis present

## 2019-05-29 NOTE — Therapy (Signed)
[facility] [address], [address] [place], [place], [address] Phone: [phone]   Fax:  [phone]  Physical Therapy Evaluation  Patient Details  Name: Kara Hodge MRN: [id number] Date of Birth: [birth year] Referring Provider (PT): Garth Bigness, MD   Encounter Date: 05/29/2019  PT End of Session - 05/29/19 1224    Visit Number  1    Date for PT Re-Evaluation  07/24/19    PT Start Time  1145    PT Stop Time  1226    PT Time Calculation (min)  41 min    Activity Tolerance  Patient tolerated treatment well    Behavior During Therapy  [facility] for tasks assessed/performed       Past Medical History:  Diagnosis Date  . No pertinent past medical history     Past Surgical History:  Procedure Laterality Date  . BREAST SURGERY    . DILATION AND CURETTAGE OF UTERUS      There were no vitals filed for this visit.   Subjective Assessment - 05/29/19 1148    Subjective  Pt presents to PT with Lt upper trap/anterior chest pain that began November/December 2020.  Pt got off the couch in an awkward way and thinks that she might have strained the area at that time.    Pertinent History  none    Diagnostic tests  none    Patient Stated Goals  reduce Lt neck/pectoralis pain with transitions, turn neck without pain, return to upper body weight training    Currently in Pain?  Yes    Pain Score  0-No pain   up to 5/10 with sneezing and getting off of the couch   Pain Location  Chest    Pain Orientation  Left;Anterior    Pain Descriptors / Indicators  Sore;Shooting    Pain Type  Chronic pain    Pain Radiating Towards  Lt UE and upper trap- intermittent    Pain Onset  More than a month ago    Pain Frequency  Intermittent    Aggravating Factors   getting off the couch, sneezing, turning to the Lt with head    Pain Relieving Factors  it goes away quickly after onset         [facility] PT Assessment - 05/29/19 0001      Assessment    Medical Diagnosis  trapezius muscle spasm    Referring Provider (PT)  Garth Bigness, MD    Onset Date/Surgical Date  02/28/19    Hand Dominance  Right    Next MD Visit  none    Prior Therapy  none      Precautions   Precautions  None      Restrictions   Weight Bearing Restrictions  No      Balance Screen   Has the patient fallen in the past 6 months  No    Has the patient had a decrease in activity level because of a fear of falling?   No    Is the patient reluctant to leave their home because of a fear of falling?   No      Home Public house manager residence    Living Arrangements  Spouse/significant other;Children      Prior Function   Level of Independence  Independent    Vocation  Full time employment    Vocation Requirements  professor- teaching online    Leisure  running, [employer] training  Cognition   Overall Cognitive Status  Within Functional Limits for tasks assessed      Observation/Other Assessments   Focus on Therapeutic Outcomes (FOTO)   33% limitation      Posture/Postural Control   Posture/Postural Control  Postural limitations    Postural Limitations  Rounded Shoulders;Forward head      ROM / Strength   AROM / PROM / Strength  AROM;Strength      AROM   Overall AROM   Within functional limits for tasks performed    Overall AROM Comments  full cervical and UE A/ROM.  Pt with stretch across Lt pectoralis and upper traps with cervical sidebending and rotation to the Rt      Strength   Overall Strength  Within functional limits for tasks performed    Overall Strength Comments  4+/5 bil UE strength      Palpation   Spinal mobility  reduced thoracic segmental mobility without pain    Palpation comment  tension over Lt>Rt upper traps.  Lt pectoralis tension over lateral portion at insertion on humerus and more so with passive shoulder extension      Transfers   Transfers  Independent with all Transfers       Ambulation/Gait   Ambulation/Gait  Yes    Gait Pattern  Within Functional Limits                Objective measurements completed on examination: See above findings.              PT Education - 05/29/19 1216    Education Details  Access Code: [email or web address]    Person(s) Educated  Patient    Methods  Explanation;Demonstration    Comprehension  Verbalized understanding;Returned demonstration       PT Short Term Goals - 05/29/19 1233      PT SHORT TERM GOAL #1   Title  be independent in initial HEP    Time  4    Period  Weeks    Status  New    Target Date  06/26/19      PT SHORT TERM GOAL #2   Title  report a 25% reduction in the frequency of Lt upper trap/pectoralis pain with getting off the couch    Time  4    Period  Weeks    Status  New    Target Date  06/26/19      PT SHORT TERM GOAL #3   Title  initiate weight training for upper body weights and verbalize how to safely progress    Time  4    Period  Weeks    Status  New    Target Date  06/26/19        PT Long Term Goals - 05/29/19 1234      PT LONG TERM GOAL #1   Title  be independent in advanced HEP including full return to upper body weight training    Time  8    Period  Weeks    Status  New    Target Date  07/24/19      PT LONG TERM GOAL #2   Title  reduce FOTO to < or = to 23% limitation    Time  8    Period  Weeks    Status  New    Target Date  07/24/19      PT LONG TERM GOAL #3   Title  report a 75% reduction in Lt upper trap/pec  pain with turning head with driving and getting off the couch    Time  8    Period  Weeks    Status  New    Target Date  07/24/19      PT LONG TERM GOAL #4   Title  verbalize and demonstrate neutral posture and report postural corrections and position changes throughout work day    Time  8    Period  Weeks    Status  New    Target Date  07/24/19             Plan - 05/29/19 1241    Clinical Impression Statement  Pt presents to PT with a 3  month history of Lt sided upper trap and anterior chest pain.  Pt reports that she feels that she strained her muscle when getting off the couch in an awkward fashion.  Pt reports intermittent pain with sneezing, getting up from the couch and turning head to the Lt with driving.  Pt has stopped lifting weights since the pain began and would like to return to this to aid in her weight loss plan.  Pt demonstrates forward head and rounded shoulder posture and is able to correct her alignment with verbal cues.  Pt with full cervical and UE A/ROM with Lt chest/upper trap pain with cervical motion to the Rt and Lt shoulder extension.  Pt with palpable trigger points over Lt upper trap and tension at distal insertion of the Lt pectoralis.  There is a small pocket of inflammation distal to Lt clavicle.  Pt will benefit from skilled PT for flexibility, postural strength, manual and instruction in safe return to upper body weight training.    Examination-Activity Limitations  Reach Overhead;Transfers    Examination-Participation Restrictions  Driving    Stability/Clinical Decision Making  Stable/Uncomplicated    Clinical Decision Making  Low    Rehab Potential  Excellent    PT Frequency  1x / week    PT Duration  8 weeks    PT Treatment/Interventions  ADLs/Self Care Home Management;Cryotherapy;Electrical Stimulation;Neuromuscular re-education;Therapeutic exercise;Therapeutic activities;Moist Heat;Functional mobility training;Patient/family education;Manual techniques;Dry needling;Passive range of motion;Taping;Joint Manipulations;Spinal Manipulations    PT Next Visit Plan  dry needling to Lt upper trap and pec, postural strength, review HEP    PT Home Exercise Plan  Access Code: [email or web address]    Consulted and Agree with Plan of Care  Patient       Patient will benefit from skilled therapeutic intervention in order to improve the following deficits and impairments:  Postural dysfunction, Improper body mechanics,  Pain, Increased muscle spasms  Visit Diagnosis: Cervicalgia - Plan: PT plan of care cert/re-cert  Cramp and spasm - Plan: PT plan of care cert/re-cert  Muscle weakness (generalized) - Plan: PT plan of care cert/re-cert     Problem List There are no problems to display for this patient.   Sigurd Sos, PT 05/29/19 12:45 PM  [facility] [address] [place], [place], [address] Phone: [phone]   Fax:  [phone]  Name: YAELIS SCHARFENBERG MRN: [id number] Date of Birth: Jul 25, 1975

## 2019-05-29 NOTE — Patient Instructions (Signed)
Access Code: [facility]  URL: [email or web address]  Date: 05/29/2019  Prepared by: Lorrene Reid   Exercises Doorway Pec Stretch at 90 Degrees Abduction - 10 reps - 2 sets - 20 hold - 2x daily - 7x weekly Seated Upper Trapezius Stretch - 3 reps - 2 sets - 20 hold - 3x daily - 7x weekly Seated Cervical Rotation AROM - 3 reps - 1 sets - 20 hold - 3x daily - 7x weekly Sidelying Open Book Thoracic Rotation with Knee on Foam Roll - 10 reps - 1 sets - 5 hold - 3x daily - 7x weekly Supine Static Chest Stretch on Foam Roll - 1x daily - 7x weekly

## 2019-06-06 ENCOUNTER — Ambulatory Visit: Payer: Commercial Managed Care - PPO | Attending: Family Medicine | Admitting: Physical Therapy

## 2019-06-06 ENCOUNTER — Other Ambulatory Visit: Payer: Self-pay

## 2019-06-06 DIAGNOSIS — M6281 Muscle weakness (generalized): Secondary | ICD-10-CM | POA: Insufficient documentation

## 2019-06-06 DIAGNOSIS — M542 Cervicalgia: Secondary | ICD-10-CM | POA: Insufficient documentation

## 2019-06-06 DIAGNOSIS — R252 Cramp and spasm: Secondary | ICD-10-CM | POA: Diagnosis present

## 2019-06-06 NOTE — Patient Instructions (Signed)
Access Code: [facility]  URL: [email or web address]  Date: 06/06/2019  Prepared by: Solon Palm   Exercises Doorway Pec Stretch at 90 Degrees Abduction - 10 reps - 2 sets - 20 hold - 2x daily - 7x weekly Seated Upper Trapezius Stretch - 3 reps - 2 sets - 20 hold - 3x daily - 7x weekly Seated Cervical Rotation AROM - 3 reps - 1 sets - 20 hold - 3x daily - 7x weekly Sidelying Open Book Thoracic Rotation with Knee on Foam Roll - 10 reps - 1 sets - 5 hold - 3x daily - 7x weekly Supine Static Chest Stretch on Foam Roll - 1x daily - 7x weekly Patient Education Trigger Point Dry Needling

## 2019-06-06 NOTE — Therapy (Signed)
[facility] [address] [place], [place], [address] Phone: [phone]   Fax:  [phone]  Physical Therapy Treatment  Patient Details  Name: Kara Hodge MRN: [id number] Date of Birth: 02/21/1976 Referring Provider (PT): Sela Hilding, MD   Encounter Date: 06/06/2019  PT End of Session - 06/06/19 1232    Visit Number  2    Date for PT Re-Evaluation  07/24/19    PT Start Time  1232    PT Stop Time  1324   10 min heat at end   PT Time Calculation (min)  52 min    Activity Tolerance  Patient tolerated treatment well    Behavior During Therapy  [facility] for tasks assessed/performed       Past Medical History:  Diagnosis Date  . No pertinent past medical history     Past Surgical History:  Procedure Laterality Date  . BREAST SURGERY    . DILATION AND CURETTAGE OF UTERUS      There were no vitals filed for this visit.  Subjective Assessment - 06/06/19 1232    Patient Stated Goals  reduce Lt neck/pectoralis pain with transitions, turn neck without pain, return to upper body weight training    Currently in Pain?  Yes    Pain Score  2     Pain Location  Chest    Pain Orientation  Left    Pain Descriptors / Indicators  Shooting;Sore    Pain Type  Chronic pain                       [facility] Adult PT Treatment/Exercise - 06/06/19 0001      Self-Care   Self-Care  Other Self-Care Comments    Other Self-Care Comments   DN education      Exercises   Exercises  Neck      Neck Exercises: Sidelying   Other Sidelying Exercise  open book on bolster x 5 each way with prolonged stretch at end      Modalities   Modalities  Moist Heat      Moist Heat Therapy   Number Minutes Moist Heat  10 Minutes    Moist Heat Location  Shoulder      Manual Therapy   Manual Therapy  Soft tissue mobilization    Manual therapy comments  Skilled palpation and monitoring of soft tissues during DN    Soft tissue  mobilization  IASTM to left pectoralis, UT and cervical spine      Neck Exercises: Stretches   Upper Trapezius Stretch  Left;2 reps;20 seconds    Levator Stretch  Left;2 reps;20 seconds    Chest Stretch  1 rep;60 seconds    Chest Stretch Limitations  on bolster vertically    Other Neck Stretches  doorway 4x 20 sec; 2 at 90 deg and 2 at below 90 deg       Trigger Point Dry Needling - 06/06/19 0001    Consent Given?  Yes    Education Handout Provided  Yes    Muscles Treated Head and Neck  Upper trapezius;Cervical multifidi;Levator scapulae    Muscles Treated Upper Quadrant  Pectoralis major;Pectoralis minor    Dry Needling Comments  left    Upper Trapezius Response  Twitch reponse elicited;Palpable increased muscle length    Levator Scapulae Response  Palpable increased muscle length    Cervical multifidi Response  Palpable increased muscle length    Pectoralis Major  Response  Palpable increased muscle length    Pectoralis Minor Response  Palpable increased muscle length           PT Education - 06/06/19 1323    Education Details  DN education and aftercare    Person(s) Educated  Patient    Methods  Explanation;Handout    Comprehension  Verbalized understanding       PT Short Term Goals - 05/29/19 1233      PT SHORT TERM GOAL #1   Title  be independent in initial HEP    Time  4    Period  Weeks    Status  New    Target Date  06/26/19      PT SHORT TERM GOAL #2   Title  report a 25% reduction in the frequency of Lt upper trap/pectoralis pain with getting off the couch    Time  4    Period  Weeks    Status  New    Target Date  06/26/19      PT SHORT TERM GOAL #3   Title  initiate weight training for upper body weights and verbalize how to safely progress    Time  4    Period  Weeks    Status  New    Target Date  06/26/19        PT Long Term Goals - 05/29/19 1234      PT LONG TERM GOAL #1   Title  be independent in advanced HEP including full return to  upper body weight training    Time  8    Period  Weeks    Status  New    Target Date  07/24/19      PT LONG TERM GOAL #2   Title  reduce FOTO to < or = to 23% limitation    Time  8    Period  Weeks    Status  New    Target Date  07/24/19      PT LONG TERM GOAL #3   Title  report a 75% reduction in Lt upper trap/pec pain with turning head with driving and getting off the couch    Time  8    Period  Weeks    Status  New    Target Date  07/24/19      PT LONG TERM GOAL #4   Title  verbalize and demonstrate neutral posture and report postural corrections and position changes throughout work day    Time  8    Period  Weeks    Status  New    Target Date  07/24/19            Plan - 06/06/19 1324    Clinical Impression Statement  Patient responded very well to DN and manual therapy today. HEP was reviewed and modifications made as needed. No goals met as only second visit.    PT Treatment/Interventions  ADLs/Self Care Home Management;Cryotherapy;Electrical Stimulation;Neuromuscular re-education;Therapeutic exercise;Therapeutic activities;Moist Heat;Functional mobility training;Patient/family education;Manual techniques;Dry needling;Passive range of motion;Taping;Joint Manipulations;Spinal Manipulations    PT Next Visit Plan  assess DN and continue prn. postural strength, progress HEP    PT Home Exercise Plan  Access Code: [facility]       Patient will benefit from skilled therapeutic intervention in order to improve the following deficits and impairments:  Postural dysfunction, Improper body mechanics, Pain, Increased muscle spasms  Visit Diagnosis: Cervicalgia  Cramp and spasm  Muscle weakness (generalized)  Problem List There are no problems to display for this patient.   Almyra Free Corwyn Vora PT 06/06/2019, 1:32 PM  [facility] [address], [place], [place], [address] Phone: [phone]   Fax:   [phone]  Name: Kara Hodge MRN: [id number] Date of Birth: [birth year]

## 2019-06-09 ENCOUNTER — Ambulatory Visit: Payer: Commercial Managed Care - PPO | Attending: Internal Medicine

## 2019-06-09 DIAGNOSIS — Z23 Encounter for immunization: Secondary | ICD-10-CM | POA: Insufficient documentation

## 2019-06-09 NOTE — Progress Notes (Signed)
   Covid-19 Vaccination Clinic  Name:  Kara Hodge    MRN: [id number] DOB: Aug 13, 1975  06/09/2019  Kara Hodge was observed post Covid-19 immunization for 15 minutes without incident. She was provided with Vaccine Information Sheet and instruction to access the V-Safe system.   Kara Hodge was instructed to call 911 with any severe reactions post vaccine: Marland Kitchen Difficulty breathing  . Swelling of face and throat  . A fast heartbeat  . A bad rash all over body  . Dizziness and weakness   Immunizations Administered    Name Date Dose VIS Date Route   Pfizer COVID-19 Vaccine 06/09/2019  4:57 PM 0.3 mL 03/16/2019 Intramuscular   Manufacturer: [employer], [employer]   Lot: [id number]   NDC: [address]-2

## 2019-06-13 ENCOUNTER — Encounter: Payer: Self-pay | Admitting: Physical Therapy

## 2019-06-13 ENCOUNTER — Ambulatory Visit: Payer: Commercial Managed Care - PPO | Admitting: Physical Therapy

## 2019-06-13 ENCOUNTER — Other Ambulatory Visit: Payer: Self-pay

## 2019-06-13 DIAGNOSIS — M6281 Muscle weakness (generalized): Secondary | ICD-10-CM

## 2019-06-13 DIAGNOSIS — M542 Cervicalgia: Secondary | ICD-10-CM | POA: Diagnosis not present

## 2019-06-13 DIAGNOSIS — R252 Cramp and spasm: Secondary | ICD-10-CM

## 2019-06-13 NOTE — Patient Instructions (Signed)
Access Code: [facility] URL: [email or web address] Date: 06/13/2019 Prepared by: Solon Palm  Exercises Doorway Pec Stretch at 90 Degrees Abduction - 10 reps - 2 sets - 20 hold - 2x daily - 7x weekly Seated Upper Trapezius Stretch - 3 reps - 2 sets - 20 hold - 3x daily - 7x weekly Seated Cervical Rotation AROM - 3 reps - 1 sets - 20 hold - 3x daily - 7x weekly Sidelying Open Book Thoracic Rotation with Knee on Foam Roll - 10 reps - 1 sets - 5 hold - 3x daily - 7x weekly Supine Static Chest Stretch on Foam Roll - 1x daily - 7x weekly Supine Shoulder Horizontal Abduction with Dumbbells - 10 reps - 3 sets - 1x daily - 7x weekly Seated Scapular Protraction - 10 reps - 3 sets - 1x daily - 7x weekly Patient Education Trigger Point Dry Needling

## 2019-06-13 NOTE — Therapy (Signed)
[facility] [address] [place], [place], [address] Phone: [phone]   Fax:  [phone]  Physical Therapy Treatment  Patient Details  Name: Kara Hodge MRN: [id number] Date of Birth: January 16, 1976 Referring Provider (PT): Garth Bigness, MD   Encounter Date: 06/13/2019  PT End of Session - 06/13/19 1234    Visit Number  3    Date for PT Re-Evaluation  07/24/19    PT Start Time  1234    PT Stop Time  1316    PT Time Calculation (min)  42 min    Activity Tolerance  Patient tolerated treatment well    Behavior During Therapy  [facility] for tasks assessed/performed       Past Medical History:  Diagnosis Date  . No pertinent past medical history     Past Surgical History:  Procedure Laterality Date  . BREAST SURGERY    . DILATION AND CURETTAGE OF UTERUS      There were no vitals filed for this visit.  Subjective Assessment - 06/13/19 1235    Subjective  Pain improving. Still some in chest and UT but also under shoulder blade.    Patient Stated Goals  reduce Lt neck/pectoralis pain with transitions, turn neck without pain, return to upper body weight training    Currently in Pain?  Yes    Pain Score  3     Pain Location  Scapula    Pain Orientation  Left    Pain Descriptors / Indicators  Sore                       [facility] Adult PT Treatment/Exercise - 06/13/19 0001      Neck Exercises: Machines for Strengthening   Cybex Row  35# x 15    Lat Pull  25# x 15    Other Machines for Strengthening  ext 20# x 15       Neck Exercises: Seated   Other Seated Exercise  left shoulder protraction x 5      Neck Exercises: Supine   Other Supine Exercise  horiz ABD T and Y x 10; 2# wt x 10 with eccentric lowering      Neck Exercises: Prone   Other Prone Exercise  horiz ABD 2# x 12; Y with no weight x 15      Manual Therapy   Manual Therapy  Soft tissue mobilization    Manual therapy comments   Skilled palpation and monitoring of soft tissues during DN    Soft tissue mobilization  to left UT/rhomboids      Neck Exercises: Stretches   Other Neck Stretches  doorway 90/90 x 30 sec       Trigger Point Dry Needling - 06/13/19 0001    Consent Given?  Yes    Education Handout Provided  Previously provided    Muscles Treated Head and Neck  Upper trapezius;Cervical multifidi    Muscles Treated Upper Quadrant  Rhomboids    Dry Needling Comments  left    Upper Trapezius Response  Twitch reponse elicited;Palpable increased muscle length    Cervical multifidi Response  Palpable increased muscle length    Rhomboids Response  Palpable increased muscle length;Twitch response elicited           PT Education - 06/13/19 1330    Education Details  HEP progressed; MFR with ball to pectorals and rhomboids.    Person(s) Educated  Patient  Methods  Explanation;Demonstration;Handout    Comprehension  Verbalized understanding;Returned demonstration       PT Short Term Goals - 05/29/19 1233      PT SHORT TERM GOAL #1   Title  be independent in initial HEP    Time  4    Period  Weeks    Status  New    Target Date  06/26/19      PT SHORT TERM GOAL #2   Title  report a 25% reduction in the frequency of Lt upper trap/pectoralis pain with getting off the couch    Time  4    Period  Weeks    Status  New    Target Date  06/26/19      PT SHORT TERM GOAL #3   Title  initiate weight training for upper body weights and verbalize how to safely progress    Time  4    Period  Weeks    Status  New    Target Date  06/26/19        PT Long Term Goals - 06/13/19 1328      PT LONG TERM GOAL #1   Title  be independent in advanced HEP including full return to upper body weight training    Status  On-going      PT LONG TERM GOAL #3   Title  report a 75% reduction in Lt upper trap/pec pain with turning head with driving and getting off the couch    Status  On-going      PT LONG TERM GOAL  #4   Title  verbalize and demonstrate neutral posture and report postural corrections and position changes throughout work day    Status  On-going            Plan - 06/13/19 1327    Clinical Impression Statement  Patient progressing well toward goals. Pain is decreasing and she tolerated weighted TE to day without pain. Great response to DN in UT. Some new tightness in left rhomboids. MFR with ball demo'd for here and pectorals.       Patient will benefit from skilled therapeutic intervention in order to improve the following deficits and impairments:     Visit Diagnosis: Cervicalgia  Cramp and spasm  Muscle weakness (generalized)     Problem List There are no problems to display for this patient.   Almyra Free Jeselle Hiser PT 06/13/2019, 1:31 PM  [facility] [address], [place], [place], [address] Phone: [phone]   Fax:  [phone]  Name: Kara Hodge MRN: [id number] Date of Birth: March 18, 1976

## 2019-06-20 ENCOUNTER — Ambulatory Visit: Payer: Commercial Managed Care - PPO | Admitting: Physical Therapy

## 2019-06-27 ENCOUNTER — Ambulatory Visit: Payer: Commercial Managed Care - PPO | Admitting: Physical Therapy

## 2019-06-27 ENCOUNTER — Other Ambulatory Visit: Payer: Self-pay

## 2019-06-27 ENCOUNTER — Encounter: Payer: Self-pay | Admitting: Physical Therapy

## 2019-06-27 DIAGNOSIS — M6281 Muscle weakness (generalized): Secondary | ICD-10-CM

## 2019-06-27 DIAGNOSIS — M542 Cervicalgia: Secondary | ICD-10-CM

## 2019-06-27 DIAGNOSIS — R252 Cramp and spasm: Secondary | ICD-10-CM

## 2019-06-27 NOTE — Therapy (Addendum)
[facility] [address] [place], [place], [address] Phone: [phone]   Fax:  [phone]  Physical Therapy Treatment and Discharge Summary  Patient Details  Name: Kara Hodge MRN: [id number] Date of Birth: Jun 01, 1975 Referring Provider (PT): Sela Hilding, MD   Encounter Date: 06/27/2019  PT End of Session - 06/27/19 1238    Visit Number  4    Date for PT Re-Evaluation  07/24/19    PT Start Time  1238    PT Stop Time  1319    PT Time Calculation (min)  41 min    Activity Tolerance  Patient tolerated treatment well    Behavior During Therapy  [facility] for tasks assessed/performed       Past Medical History:  Diagnosis Date  . No pertinent past medical history     Past Surgical History:  Procedure Laterality Date  . BREAST SURGERY    . DILATION AND CURETTAGE OF UTERUS      There were no vitals filed for this visit.  Subjective Assessment - 06/27/19 1238    Subjective  Patient reports achiness in left scalene area. Pt stretching everyday and using light weights. She reports 70% improvement.    Patient Stated Goals  reduce Lt neck/pectoralis pain with transitions, turn neck without pain, return to upper body weight training    Currently in Pain?  Yes    Pain Score  4     Pain Location  Neck    Pain Orientation  Left    Pain Descriptors / Indicators  Sore                       [facility] Adult PT Treatment/Exercise - 06/27/19 0001      Neck Exercises: Machines for Strengthening   Cybex Row  35# 2 x 15    Lat Pull  25# 2 x 15    Other Machines for Strengthening  ext 20#  2 x 15     Other Machines for Strengthening  15# lawn mower 1 x15 bil, 25# upright row x 15       Manual Therapy   Manual Therapy  Soft tissue mobilization;Joint mobilization    Manual therapy comments  Skilled palpation and monitoring of soft tissues during DN    Joint Mobilization  PA mobs to left C2 - T6 gd III/IV     Soft tissue mobilization  to left UT, levator, paraspinals and rhormboids      Neck Exercises: Stretches   Other Neck Stretches  1st rib mob left 3x 20 sec       Trigger Point Dry Needling - 06/27/19 0001    Consent Given?  Yes    Education Handout Provided  Previously provided    Muscles Treated Head and Neck  Upper trapezius;Cervical multifidi;Levator scapulae    Muscles Treated Upper Quadrant  Rhomboids    Dry Needling Comments  left    Upper Trapezius Response  Twitch reponse elicited;Palpable increased muscle length    Levator Scapulae Response  Palpable increased muscle length    Cervical multifidi Response  Twitch reponse elicited;Palpable increased muscle length    Rhomboids Response  Palpable increased muscle length             PT Short Term Goals - 05/29/19 1233      PT SHORT TERM GOAL #1   Title  be independent in initial HEP    Time  4  Period  Weeks    Status  New    Target Date  06/26/19      PT SHORT TERM GOAL #2   Title  report a 25% reduction in the frequency of Lt upper trap/pectoralis pain with getting off the couch    Time  4    Period  Weeks    Status  New    Target Date  06/26/19      PT SHORT TERM GOAL #3   Title  initiate weight training for upper body weights and verbalize how to safely progress    Time  4    Period  Weeks    Status  New    Target Date  06/26/19        PT Long Term Goals - 06/27/19 1240      PT LONG TERM GOAL #1   Title  be independent in advanced HEP including full return to upper body weight training    Status  Partially Met      PT LONG TERM GOAL #3   Title  report a 75% reduction in Lt upper trap/pec pain with turning head with driving and getting off the couch    Baseline  driving good; 13% better overall with ADLS; still feels in ant neck with getting off couch    Status  Partially Met      PT LONG TERM GOAL #4   Title  verbalize and demonstrate neutral posture and report postural corrections and  position changes throughout work day    Status  Achieved            Plan - 06/27/19 1325    Clinical Impression Statement  Patient is progressing well toward goals reporting 70% improvement overall. She still is experiencing tightness in scalenes and 1st rib mobilization/stretch was issued today for HEP. Very good response to DN and manual therapy. Pt reported feeling much better at end of session.    PT Frequency  1x / week    PT Duration  8 weeks    PT Treatment/Interventions  ADLs/Self Care Home Management;Cryotherapy;Electrical Stimulation;Neuromuscular re-education;Therapeutic exercise;Therapeutic activities;Moist Heat;Functional mobility training;Patient/family education;Manual techniques;Dry needling;Passive range of motion;Taping;Joint Manipulations;Spinal Manipulations    PT Next Visit Plan  assess 1st rib mob/scalene stretch; continue manual/mobs prn. postural strength    PT Home Exercise Plan  Access Code: [email or web address]    Consulted and Agree with Plan of Care  Patient       Patient will benefit from skilled therapeutic intervention in order to improve the following deficits and impairments:  Postural dysfunction, Improper body mechanics, Pain, Increased muscle spasms  Visit Diagnosis: Cervicalgia  Cramp and spasm  Muscle weakness (generalized)     Problem List There are no problems to display for this patient.   Almyra Free Katria Botts PT 06/27/2019, 1:28 PM  [facility] [address] [place], [place], [address] Phone: [phone]   Fax:  [phone]  Name: Kara Hodge MRN: [id number] Date of Birth: 1976/01/15  PHYSICAL THERAPY DISCHARGE SUMMARY  Visits from Start of Care: 4  Current functional level related to goals / functional outcomes: Unable to assess as pt did not return. See above.   Remaining deficits: See above   Education / Equipment: HEP Plan: Patient agrees to discharge.  Patient goals  were partially met. Patient is being discharged due to the patient's request.  ?????    Madelyn Flavors, PT 08/14/19 1:51 PM [facility] [address],  [place], [place] [address] Phone # [phone] Fax [phone]

## 2019-06-27 NOTE — Patient Instructions (Signed)
Access Code: [facility] URL: [email or web address] Date: 06/27/2019 Prepared by: Solon Palm  Exercises Doorway Pec Stretch at 90 Degrees Abduction - 2 x daily - 7 x weekly - 10 reps - 2 sets - 20 hold Seated Upper Trapezius Stretch - 3 x daily - 7 x weekly - 3 reps - 2 sets - 20 hold Seated Cervical Rotation AROM - 3 x daily - 7 x weekly - 3 reps - 1 sets - 20 hold Sidelying Open Book Thoracic Rotation with Knee on Foam Roll - 3 x daily - 7 x weekly - 10 reps - 1 sets - 5 hold Supine Static Chest Stretch on Foam Roll - 1 x daily - 7 x weekly Supine Shoulder Horizontal Abduction with Dumbbells - 1 x daily - 7 x weekly - 10 reps - 3 sets Seated Scapular Protraction - 1 x daily - 7 x weekly - 10 reps - 3 sets First Rib Mobilization with Strap - 2 x daily - 7 x weekly - 1 sets - 3 reps - 15-20 sec hold  Patient Education Trigger Point Dry Needling

## 2019-06-30 ENCOUNTER — Ambulatory Visit: Payer: Commercial Managed Care - PPO | Attending: Internal Medicine

## 2019-06-30 DIAGNOSIS — Z23 Encounter for immunization: Secondary | ICD-10-CM

## 2019-06-30 NOTE — Progress Notes (Signed)
   Covid-19 Vaccination Clinic  Name:  Kara Hodge    MRN: [id number] DOB: 1975-09-26  06/30/2019  [name] was observed post Covid-19 immunization for 15 minutes without incident. She was provided with Vaccine Information Sheet and instruction to access the V-Safe system.   [name] was instructed to call 911 with any severe reactions post vaccine: Marland Kitchen Difficulty breathing  . Swelling of face and throat  . A fast heartbeat  . A bad rash all over body  . Dizziness and weakness   Immunizations Administered    Name Date Dose VIS Date Route   Pfizer COVID-19 Vaccine 06/30/2019  9:44 AM 0.3 mL 03/16/2019 Intramuscular   Manufacturer: [employer], [employer]   Lot: [id number]   NDC: [address]-2

## 2019-07-18 ENCOUNTER — Ambulatory Visit: Payer: Commercial Managed Care - PPO | Admitting: Physical Therapy

## 2019-07-25 ENCOUNTER — Ambulatory Visit: Payer: Commercial Managed Care - PPO | Admitting: Physical Therapy

## 2019-08-01 ENCOUNTER — Ambulatory Visit: Payer: Commercial Managed Care - PPO

## 2019-08-08 ENCOUNTER — Encounter: Payer: Commercial Managed Care - PPO | Admitting: Physical Therapy

## 2019-08-15 ENCOUNTER — Encounter: Payer: Commercial Managed Care - PPO | Admitting: Physical Therapy

## 2020-12-10 DIAGNOSIS — Z0289 Encounter for other administrative examinations: Secondary | ICD-10-CM

## 2020-12-11 ENCOUNTER — Encounter (INDEPENDENT_AMBULATORY_CARE_PROVIDER_SITE_OTHER): Payer: Self-pay | Admitting: Bariatrics

## 2020-12-12 ENCOUNTER — Encounter (INDEPENDENT_AMBULATORY_CARE_PROVIDER_SITE_OTHER): Payer: Self-pay | Admitting: Bariatrics

## 2020-12-12 ENCOUNTER — Other Ambulatory Visit: Payer: Self-pay

## 2020-12-12 ENCOUNTER — Ambulatory Visit (INDEPENDENT_AMBULATORY_CARE_PROVIDER_SITE_OTHER): Payer: Commercial Managed Care - PPO | Admitting: Bariatrics

## 2020-12-12 VITALS — BP 126/78 | HR 65 | Temp 97.7°F | Ht 67.0 in | Wt 228.0 lb

## 2020-12-12 DIAGNOSIS — Z9189 Other specified personal risk factors, not elsewhere classified: Secondary | ICD-10-CM | POA: Diagnosis not present

## 2020-12-12 DIAGNOSIS — E038 Other specified hypothyroidism: Secondary | ICD-10-CM | POA: Diagnosis not present

## 2020-12-12 DIAGNOSIS — R632 Polyphagia: Secondary | ICD-10-CM | POA: Diagnosis not present

## 2020-12-12 DIAGNOSIS — R5383 Other fatigue: Secondary | ICD-10-CM | POA: Diagnosis not present

## 2020-12-12 DIAGNOSIS — Z1331 Encounter for screening for depression: Secondary | ICD-10-CM

## 2020-12-12 DIAGNOSIS — E66812 Obesity, class 2: Secondary | ICD-10-CM

## 2020-12-12 DIAGNOSIS — E6609 Other obesity due to excess calories: Secondary | ICD-10-CM

## 2020-12-12 DIAGNOSIS — E559 Vitamin D deficiency, unspecified: Secondary | ICD-10-CM | POA: Diagnosis not present

## 2020-12-12 DIAGNOSIS — R0602 Shortness of breath: Secondary | ICD-10-CM

## 2020-12-12 DIAGNOSIS — Z6835 Body mass index (BMI) 35.0-35.9, adult: Secondary | ICD-10-CM

## 2020-12-12 DIAGNOSIS — R7309 Other abnormal glucose: Secondary | ICD-10-CM

## 2020-12-12 NOTE — Progress Notes (Signed)
Chief Complaint:   OBESITY Kara Hodge (MR# [id number]) is a 45 y.o. female who presents for evaluation and treatment of obesity and related comorbidities. Current BMI is Body mass index is 35.71 kg/m. Kara Hodge has been struggling with her weight for many years and has been unsuccessful in either losing weight, maintaining weight loss, or reaching her healthy weight goal.  Kara Hodge is currently in the action stage of change and ready to dedicate time achieving and maintaining a healthier weight. Kara Hodge is interested in becoming our patient and working on intensive lifestyle modifications including (but not limited to) diet and exercise for weight loss.  Kara Hodge does like to cook but notes time as an obstacle. She craves chips.  Kara Hodge's habits were reviewed today and are as follows: she struggles with family and or coworkers weight loss sabotage, her desired weight loss is 98 lbs, she started gaining weight in 2014, her heaviest weight ever was 230 pounds, she has significant food cravings issues, she snacks frequently in the evenings, she is frequently drinking liquids with calories, she frequently makes poor food choices, she has problems with excessive hunger, she frequently eats larger portions than normal, she has binge eating behaviors, and she struggles with emotional eating.  Depression Screen Kara Hodge's Food and Mood (modified PHQ-9) score was 18.  Depression screen PHQ 2/9 12/12/2020  Decreased Interest 2  Down, Depressed, Hopeless 3  PHQ - 2 Score 5  Altered sleeping 1  Tired, decreased energy 1  Change in appetite 3  Feeling bad or failure about yourself  3  Trouble concentrating 3  Moving slowly or fidgety/restless 1  Suicidal thoughts 1  PHQ-9 Score 18  Difficult doing work/chores Somewhat difficult   Subjective:   1. Other fatigue Kara Hodge admits to daytime somnolence and denies waking up still tired. Patent has a history of symptoms of daytime fatigue. Kara Hodge generally gets   12  hours of sleep per night, and states that she has generally restful sleep. Snoring is present. Apneic episodes are present. Epworth Sleepiness Score is 7.  2. SOB (shortness of breath) on exertion Kara Hodge notes increasing shortness of breath with exercising and seems to be worsening over time with weight gain. She notes getting out of breath sooner with activity than she used to. This has gotten worse recently. Kara Hodge denies shortness of breath at rest or orthopnea.  3. Subclinical hypothyroidism Pt is not on medication.  4. Binge eating Diagnosed by [name]. She has been on phentermine and Contrave before. She is binge eating 1-2 times a week.   5. Polyphagia Kara Hodge notes highest hunger is between 3-5 PM.  6. Elevated glucose She is not on medication.  7. Vitamin D deficiency Pt does not take a Vit D supplement.  8. At risk for activity intolerance Kara Hodge is at risk for exercise intolerance due to obesity and weather.  Assessment/Plan:   1. Other fatigue Kara Hodge does feel that her weight is causing her energy to be lower than it should be. Fatigue may be related to obesity, depression or many other causes. Labs will be ordered, and in the meanwhile, Kara Hodge will focus on self care including making healthy food choices, increasing physical activity and focusing on stress reduction. Check labs today.  - EKG 12-Lead - Comprehensive metabolic panel  2. SOB (shortness of breath) on exertion Kara Hodge does feel that she gets out of breath more easily that she used to when she exercises. Kara Hodge's shortness of breath appears to be  obesity related and exercise induced. She has agreed to work on weight loss and gradually increase exercise to treat her exercise induced shortness of breath. Will continue to monitor closely. Check labs today.  - Lipid Panel With LDL/HDL Ratio  3. Subclinical hypothyroidism Patient with long-standing hypothyroidism, on levothyroxine therapy. She appears  euthyroid. Orders and follow up as documented in patient record.  Counseling Good thyroid control is important for overall health. Supratherapeutic thyroid levels are dangerous and will not improve weight loss results. The correct way to take levothyroxine is fasting, with water, separated by at least 30 minutes from breakfast, and separated by more than 4 hours from calcium, iron, multivitamins, acid reflux medications (PPIs).  Check labs today.  - T3 - T4, free - TSH  4. Binge eating Behavior modification techniques were discussed today to help Kara Hodge deal with her emotional/non-hunger eating behaviors.  Orders and follow up as documented in patient record.  Pt will work with Dr. Dewaine Conger.  5. Polyphagia Pt will increase protein and water intake. Intensive lifestyle modifications are the first line treatment for this issue. We discussed several lifestyle modifications today and she will continue to work on diet, exercise and weight loss efforts. Orders and follow up as documented in patient record.  Counseling Polyphagia is excessive hunger. Causes can include: low blood sugars, hypERthyroidism, PMS, lack of sleep, stress, insulin resistance, diabetes, certain medications, and diets that are deficient in protein and fiber.   6. Elevated glucose Fasting labs will be obtained and results with be discussed with Kara Hodge in 2 weeks at her follow up visit. In the meanwhile Kara Hodge was started on a lower simple carbohydrate diet and will work on weight loss efforts.  - Hemoglobin A1c - Insulin, random  7. Vitamin D deficiency Low Vitamin D level contributes to fatigue and are associated with obesity, breast, and colon cancer. She agrees to follow-up for routine testing of Vitamin D, at least 2-3 times per year to avoid over-replacement. Check labs today.  - VITAMIN D 25 Hydroxy (Vit-D Deficiency, Fractures)  8. Depression screen Kara Hodge had a positive depression screening. Depression is commonly  associated with obesity and often results in emotional eating behaviors. We will monitor this closely and work on CBT to help improve the non-hunger eating patterns. Referral to Psychology may be required if no improvement is seen as she continues in our clinic.  9. At risk for activity intolerance Kara Hodge was given approximately 15 minutes of exercise intolerance counseling today. She is 45 y.o. female and has risk factors exercise intolerance including obesity. We discussed intensive lifestyle modifications today with an emphasis on specific weight loss instructions and strategies. Kara Hodge will slowly increase activity as tolerated.  Repetitive spaced learning was employed today to elicit superior memory formation and behavioral change.   10. Class 2 severe obesity with serious comorbidity and body mass index (BMI) of 35.0 to 35.9 in adult, unspecified obesity type (HCC)  Kara Hodge is currently in the action stage of change and her goal is to continue with weight loss efforts. I recommend Kara Hodge begin the structured treatment plan as follows:  She has agreed to the Category 3 Plan.  Meal planning. Decrease portion sizes.  Exercise goals: No exercise has been prescribed at this time.   Behavioral modification strategies: increasing lean protein intake, decreasing simple carbohydrates, increasing vegetables, increasing water intake, decreasing eating out, no skipping meals, meal planning and cooking strategies, keeping healthy foods in the home, and planning for success.  She was informed  of the importance of frequent follow-up visits to maximize her success with intensive lifestyle modifications for her multiple health conditions. She was informed we would discuss her lab results at her next visit unless there is a critical issue that needs to be addressed sooner. Kara Hodge agreed to keep her next visit at the agreed upon time to discuss these results.  Objective:   Blood pressure 126/78, pulse 65,  temperature 97.7 F (36.5 C), height 5\' 7"  (1.702 m), weight 228 lb (103.4 kg), last menstrual period 11/18/2020, SpO2 98 %. Body mass index is 35.71 kg/m.  EKG: Normal sinus rhythm, rate 74.  Indirect Calorimeter completed today shows a VO2 of 285 and a REE of 1973.  Her calculated basal metabolic rate is 11/20/2020 thus her basal metabolic rate is better than expected.  General: Cooperative, alert, well developed, in no acute distress. HEENT: Conjunctivae and lids unremarkable. Cardiovascular: Regular rhythm.  Lungs: Normal work of breathing. Neurologic: No focal deficits.   No results found for: CREATININE, BUN, NA, K, CL, CO2 No results found for: ALT, AST, GGT, ALKPHOS, BILITOT No results found for: HGBA1C No results found for: INSULIN No results found for: TSH Lab Results  Component Value Date   CHOL 234 (H) 04/09/2019   HDL 64 04/09/2019   LDLCALC 126 (H) 04/09/2019   TRIG 249 (H) 04/09/2019   CHOLHDL 3.7 04/09/2019   Lab Results  Component Value Date   WBC 13.2 (H) 12/17/2010   HGB 10.5 (L) 12/17/2010   HCT 31.7 (L) 12/17/2010   MCV 89.5 12/17/2010   PLT 208 12/17/2010   Attestation Statements:   Reviewed by clinician on day of visit: allergies, medications, problem list, medical history, surgical history, family history, social history, and previous encounter notes.  12/19/2010, CMA, am acting as Edmund Hilda for Energy manager, DO.  I have reviewed the above documentation for accuracy and completeness, and I agree with the above. [employer], DO

## 2020-12-15 ENCOUNTER — Encounter (INDEPENDENT_AMBULATORY_CARE_PROVIDER_SITE_OTHER): Payer: Self-pay | Admitting: Bariatrics

## 2020-12-15 DIAGNOSIS — Z6835 Body mass index (BMI) 35.0-35.9, adult: Secondary | ICD-10-CM | POA: Insufficient documentation

## 2020-12-15 DIAGNOSIS — E6609 Other obesity due to excess calories: Secondary | ICD-10-CM | POA: Insufficient documentation

## 2020-12-16 NOTE — Progress Notes (Signed)
Office: [phone]  /  Fax: [phone]    Date: December 30, 2020   Appointment Start Time: 11:58am Duration: 35 minutes Provider: Lawerance Cruel, Psy.D. Type of Session: Intake for Individual Therapy  Location of Patient: [employer]) Location of Provider: Provider's home (private office) Type of Contact: Telepsychological Visit via MyChart Video Visit  Informed Consent: Prior to proceeding with today's appointment, two pieces of identifying information were obtained. In addition, Kara Hodge's physical location at the time of this appointment was obtained as well a phone number she could be reached at in the event of technical difficulties. Betzabeth and this provider participated in today's telepsychological service.   The provider's role was explained to Kara Hodge. The provider reviewed and discussed issues of confidentiality, privacy, and limits therein (e.g., reporting obligations). In addition to verbal informed consent, written informed consent for psychological services was obtained prior to the initial appointment. Since the clinic is not a 24/7 crisis center, mental health emergency resources were shared and this  provider explained MyChart, e-mail, voicemail, and/or other messaging systems should be utilized only for non-emergency reasons. This provider also explained that information obtained during appointments will be placed in Kara Hodge's medical record and relevant information will be shared with other providers at [facility] for coordination of care. Kara Hodge agreed information may be shared with other [facility] providers as needed for coordination of care and by signing the service agreement document, she provided written consent for coordination of care. Prior to initiating telepsychological services, Kara Hodge completed an informed consent document, which included the development of a safety plan (i.e., an emergency contact and emergency resources) in  the event of an emergency/crisis. Kara Hodge verbally acknowledged understanding she is ultimately responsible for understanding her insurance benefits for telepsychological and in-person services. This provider also reviewed confidentiality, as it relates to telepsychological services, as well as the rationale for telepsychological services (i.e., to reduce exposure risk to COVID-19). [name]  acknowledged understanding that appointments cannot be recorded without both party consent and she is aware she is responsible for securing confidentiality on her end of the session. [name] verbally consented to proceed.  Chief Complaint/HPI: Kara Hodge was referred by Dr. Corinna Capra due to  binge eating . Per the note for the initial visit with Dr. Corinna Capra on December 12, 2020, "Diagnosed by [facility]. She has been on phentermine and Contrave before. She is binge eating 1-2 times a week. " The note for the initial appointment with Dr. Corinna Capra further indicated the following: "she struggles with family and or coworkers weight loss sabotage, her desired weight loss is 98 lbs, she started gaining weight in 2014, her heaviest weight ever was 230 pounds, she has significant food cravings issues, she snacks frequently in the evenings, she is frequently drinking liquids with calories, she frequently makes poor food choices, she has problems with excessive hunger, she frequently eats larger portions than normal, she has binge eating behaviors, and she struggles with emotional eating." [name]'s Food and Mood (modified PHQ-9) score on December 12, 2020 was 18.  During today's appointment, Kara Hodge reported she started engaging in binge eating behaviors in childhood, noting she was diagnosed with Binge Eating Disorder around 2015 by the weight loss center at [facility]. She explained she spoke with a counselor at the time, noting the last appointment was in 2016. She indicated, "I am a person that never feels full." Kara Hodge recalled in  childhood she enjoyed feeling hungry and believes the aforementioned  stems from that. Kara Hodge disclosed around age 36, she began engaging in purging behaviors (vomiting and laxative use), noting the last time was in her 65s. She indicated she still has the "desire" to engage in purging behaviors, but does not have the "energy." Currently, Kara Hodge reported she will eat "well" for 3-4 days, then will reward herself by eating "a huge amount of calories." She described the current frequency of the aforementioned as twice a week. When consuming a "huge amount of calories," she shared she may consume take out (e.g., on Saturday she consumed an appetizer and full serving of sesame chicken with rice and later that night consumed a snack). She also discussed restricting food intake to approximately 1,200 calories which leads to increased physical hunger contributing to overeating later in the day. Moreover, she indicated she is "always thinking about food" and "the next meal." She further notes she "overeat[s]" when she experiences different emotions. Furthermore, Kara Hodge discussed a history of taking Contrave, noting it helped with weight loss but she regained the weight when she ceased use. [name] denied other problems of concern.    Mental Status Examination:  Appearance: well groomed and appropriate hygiene  Behavior: appropriate to circumstances Mood: euthymic Affect: mood congruent Speech: normal in rate, volume, and tone Eye Contact: appropriate Psychomotor Activity: appropriate  Gait: unable to assess  Thought Process: linear, logical, and goal directed  Thought Content/Perception: denies suicidal and homicidal ideation, plan, and intent, no hallucinations, delusions, bizarre thinking or behavior reported or observed, and denies ideation and engagement in self-injurious behaviors Orientation: time, person, place, and purpose of appointment Memory/Concentration: memory, attention, language, and fund of  knowledge intact  Insight/Judgment: fair  Family & Psychosocial History: Saryna reported she is married and she has two children (ages 66 and 41). 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 Additionally, Jamel shared her highest level of education obtained is a Ph.D. Currently, Bellamia's social support system consists of her husband and friend. Moreover, Izora stated she resides with her husband and children.   Medical History:  Past Medical History:  Diagnosis Date   Anxiety    Back pain    Depression    GERD (gastroesophageal reflux disease)    High blood pressure    No pertinent past medical history    Past Surgical History:  Procedure Laterality Date   BREAST SURGERY     DILATION AND CURETTAGE OF UTERUS     Current Outpatient Medications on File Prior to Visit  Medication Sig Dispense Refill   FLUoxetine (PROZAC) 20 MG capsule Take 20 mg by mouth daily.     magnesium oxide (MAG-OX) 400 MG tablet Take 400 mg by mouth daily.     Multiple Vitamin (MULTIVITAMIN) capsule Take 1 capsule by mouth daily.     No current facility-administered medications on file prior to visit.  Medication compliant; no concerns.   Mental Health History: Saniya reported a history of therapeutic services throughout her life starting in her mid-30s to address loss from a miscarriage as well as depression, anxiety, and eating related concerns. She reported her PCP currently prescribes Prozac. Anaisa reported there is no history of hospitalizations for psychiatric concerns. Fidela reported anxiety runs on her father's side of the family. Shonya reported there is no history of trauma including psychological, physical , and sexual abuse, as well as neglect.   Linzy described her typical mood lately as "happy," but feels she is in a "holding pattern" due to weight. The aforementioned  contributes to depressed mood and decreased self-esteem. She also discussed experiencing challenges with recall,  noting she has "brain fog." She wonders if it is secondary to her Prozac prescription; therefore, it was recommended she reach out to her prescribing provider. She agreed. Moreover, she indicated she had COVID-19, which exacerbated the brain fog. Ellenore believes the brain fog is independent of mood. Additionally, she disclosed a history of 1-2 panic attacks approximately 1.5 years ago. Lenisha reported consuming approximately two standard drinks of alcohol on the weekends. She denied tobacco use. She denied illicit/recreational substance use. Regarding caffeine intake, Nalea reported consuming 24 oz of coffee daily. Furthermore, Eliah indicated she is not experiencing the following: hallucinations and delusions, paranoia, symptoms of mania , social withdrawal, crying spells, and obsessions and compulsions. She also denied history of and current suicidal ideation, plan, and intent; history of and current homicidal ideation, plan, and intent; and history of and current engagement in self-harm. Notably, Troy endorsed item 9 (i.e., "Do you feel that your weight problem is so hopeless that sometimes life doesn't seem worth living?") on the modified PHQ-9 during her initial appointment with Dr. Corinna Capra on December 12, 2020. Ashliegh clarified she endorsed the item due to feeling stuck with her weight and not due to suicidal ideation.   The following strengths were reported by [facility]: compassionate, driven, and good at compartmentalizing/boundary setting. The following strengths were observed by this provider: ability to express thoughts and feelings during the therapeutic session, ability to establish and benefit from a therapeutic relationship, willingness to work toward established goal(s) with the clinic and ability to engage in reciprocal conversation.   Legal History: Joni reported there is no history of legal involvement.   Structured Assessments Results: The Patient Health Questionnaire-9 (PHQ-9) is a  self-report measure that assesses symptoms and severity of depression over the course of the last two weeks. Kida obtained a score of 11 suggesting moderate depression. Kellsey finds the endorsed symptoms to be somewhat difficult. [0= Not at all; 1= Several days; 2= More than half the days; 3= Nearly every day] Little interest or pleasure in doing things 3  Feeling down, depressed, or hopeless 3  Trouble falling or staying asleep, or sleeping too much 0  Feeling tired or having little energy 0  Poor appetite or overeating 1  Feeling bad about yourself --- or that you are a failure or have let yourself or your family down 3  Trouble concentrating on things, such as reading the newspaper or watching television 1  Moving or speaking so slowly that other people could have noticed? Or the opposite --- being so fidgety or restless that you have been moving around a lot more than usual 0  Thoughts that you would be better off dead or hurting yourself in some way 0  PHQ-9 Score 11    The Generalized Anxiety Disorder-7 (GAD-7) is a brief self-report measure that assesses symptoms of anxiety over the course of the last two weeks. Shakayla obtained a score of 1 suggesting minimal anxiety. Kathrene finds the endorsed symptoms to be somewhat difficult. [0= Not at all; 1= Several days; 2= Over half the days; 3= Nearly every day] Feeling nervous, anxious, on edge 0  Not being able to stop or control worrying 0  Worrying too much about different things 0  Trouble relaxing 0  Being so restless that it's hard to sit still 0  Becoming easily annoyed or irritable 1  Feeling afraid as if something awful might happen  0  GAD-7 Score 1   Interventions:  Conducted a chart review Focused on rapport building Verbally administered PHQ-9 and GAD-7 for symptom monitoring Verbally administered Food & Mood questionnaire to assess various behaviors related to emotional eating Provided emphatic reflections and  validation Collaborated with patient on a treatment goal  Psychoeducation provided regarding physical versus emotional hunger Recommended/discussed option for longer-term therapeutic services Psychoeducation provided regarding all or nothing thinking   Provisional DSM-5 Diagnosis(es): F32.89 Other Specified Depressive Disorder, Emotional and Binge Eating Behaviors  Plan: Based on current symptomatology/concerns, this provider recommended longer-term therapeutic services. Thanh was receptive and provided verbal consent for this provider to e-mail referral options. It was also recommended she continue to meet with his provider until she is established with a new provider. She agreed. Kera appears able and willing to participate as evidenced by collaboration on a treatment goal, engagement in reciprocal conversation, and asking questions as needed for clarification. The next appointment will be scheduled in three weeks, which will be via MyChart Video Visit. The following treatment goal was established: increase coping skills. This provider will regularly review the treatment plan and medical chart to keep informed of status changes. Otto expressed understanding and agreement with the initial treatment plan of care. Cletus will be sent a handout via e-mail to utilize between now and the next appointment to increase awareness of hunger patterns and subsequent eating. Merrianne provided verbal consent during today's appointment for this provider to send the handout via e-mail.

## 2020-12-25 ENCOUNTER — Other Ambulatory Visit (INDEPENDENT_AMBULATORY_CARE_PROVIDER_SITE_OTHER): Payer: Self-pay

## 2020-12-25 ENCOUNTER — Encounter (INDEPENDENT_AMBULATORY_CARE_PROVIDER_SITE_OTHER): Payer: Self-pay | Admitting: Bariatrics

## 2020-12-25 DIAGNOSIS — E559 Vitamin D deficiency, unspecified: Secondary | ICD-10-CM | POA: Insufficient documentation

## 2020-12-25 LAB — COMPREHENSIVE METABOLIC PANEL
ALT: 23 IU/L (ref 0–32)
AST: 16 IU/L (ref 0–40)
Albumin/Globulin Ratio: 2 (ref 1.2–2.2)
Albumin: 4.4 g/dL (ref 3.8–4.8)
Alkaline Phosphatase: 70 IU/L (ref 44–121)
BUN/Creatinine Ratio: 15 (ref 9–23)
BUN: 11 mg/dL (ref 6–24)
Bilirubin Total: 0.3 mg/dL (ref 0.0–1.2)
CO2: 17 mmol/L — ABNORMAL LOW (ref 20–29)
Calcium: 9.3 mg/dL (ref 8.7–10.2)
Chloride: 104 mmol/L (ref 96–106)
Creatinine, Ser: 0.75 mg/dL (ref 0.57–1.00)
Globulin, Total: 2.2 g/dL (ref 1.5–4.5)
Glucose: 85 mg/dL (ref 65–99)
Potassium: 4.4 mmol/L (ref 3.5–5.2)
Sodium: 138 mmol/L (ref 134–144)
Total Protein: 6.6 g/dL (ref 6.0–8.5)
eGFR: 101 mL/min/{1.73_m2} (ref >59)

## 2020-12-25 LAB — LIPID PANEL WITH LDL/HDL RATIO
Cholesterol, Total: 253 mg/dL — ABNORMAL HIGH (ref 100–199)
HDL: 59 mg/dL (ref >39)
LDL Chol Calc (NIH): 155 mg/dL — ABNORMAL HIGH (ref 0–99)
LDL/HDL Ratio: 2.6 ratio (ref 0.0–3.2)
Triglycerides: 214 mg/dL — ABNORMAL HIGH (ref 0–149)
VLDL Cholesterol Cal: 39 mg/dL (ref 5–40)

## 2020-12-25 LAB — INSULIN, RANDOM: INSULIN: 11.2 u[IU]/mL (ref 2.6–24.9)

## 2020-12-25 LAB — T3: T3, Total: 124 ng/dL (ref 71–180)

## 2020-12-25 LAB — SPECIMEN STATUS REPORT

## 2020-12-25 LAB — T4, FREE: Free T4: 0.85 ng/dL (ref 0.82–1.77)

## 2020-12-25 LAB — HEMOGLOBIN A1C
Est. average glucose Bld gHb Est-mCnc: 111 mg/dL
Hgb A1c MFr Bld: 5.5 % (ref 4.8–5.6)

## 2020-12-25 LAB — VITAMIN D 25 HYDROXY (VIT D DEFICIENCY, FRACTURES): Vit D, 25-Hydroxy: 24.9 ng/mL — ABNORMAL LOW (ref 30.0–100.0)

## 2020-12-25 LAB — TSH: TSH: 3.22 u[IU]/mL (ref 0.450–4.500)

## 2020-12-26 ENCOUNTER — Ambulatory Visit (INDEPENDENT_AMBULATORY_CARE_PROVIDER_SITE_OTHER): Payer: Self-pay | Admitting: Bariatrics

## 2020-12-29 ENCOUNTER — Telehealth (INDEPENDENT_AMBULATORY_CARE_PROVIDER_SITE_OTHER): Payer: Commercial Managed Care - PPO | Admitting: Psychology

## 2020-12-30 ENCOUNTER — Telehealth (INDEPENDENT_AMBULATORY_CARE_PROVIDER_SITE_OTHER): Payer: Commercial Managed Care - PPO | Admitting: Psychology

## 2020-12-30 DIAGNOSIS — F3289 Other specified depressive episodes: Secondary | ICD-10-CM

## 2021-01-01 ENCOUNTER — Ambulatory Visit (INDEPENDENT_AMBULATORY_CARE_PROVIDER_SITE_OTHER): Payer: Commercial Managed Care - PPO | Admitting: Bariatrics

## 2021-01-01 ENCOUNTER — Encounter (INDEPENDENT_AMBULATORY_CARE_PROVIDER_SITE_OTHER): Payer: Self-pay | Admitting: Bariatrics

## 2021-01-01 ENCOUNTER — Other Ambulatory Visit: Payer: Self-pay

## 2021-01-01 VITALS — BP 128/89 | HR 77 | Temp 97.6°F | Ht 67.0 in | Wt 226.0 lb

## 2021-01-01 DIAGNOSIS — E8881 Metabolic syndrome: Secondary | ICD-10-CM | POA: Diagnosis not present

## 2021-01-01 DIAGNOSIS — E6609 Other obesity due to excess calories: Secondary | ICD-10-CM

## 2021-01-01 DIAGNOSIS — E785 Hyperlipidemia, unspecified: Secondary | ICD-10-CM

## 2021-01-01 DIAGNOSIS — E7800 Pure hypercholesterolemia, unspecified: Secondary | ICD-10-CM | POA: Diagnosis not present

## 2021-01-01 DIAGNOSIS — Z9189 Other specified personal risk factors, not elsewhere classified: Secondary | ICD-10-CM | POA: Diagnosis not present

## 2021-01-01 DIAGNOSIS — E559 Vitamin D deficiency, unspecified: Secondary | ICD-10-CM

## 2021-01-01 DIAGNOSIS — F5089 Other specified eating disorder: Secondary | ICD-10-CM | POA: Diagnosis not present

## 2021-01-01 DIAGNOSIS — Z6835 Body mass index (BMI) 35.0-35.9, adult: Secondary | ICD-10-CM

## 2021-01-01 MED ORDER — BUPROPION HCL ER (SR) 150 MG PO TB12: 150.0000 mg | ORAL_TABLET | Freq: Every day | ORAL | 0 refills | Status: DC

## 2021-01-01 MED ORDER — VITAMIN D (ERGOCALCIFEROL) 1.25 MG (50000 UNIT) PO CAPS: 50000.0000 [IU] | ORAL_CAPSULE | ORAL | 0 refills | Status: DC

## 2021-01-02 ENCOUNTER — Encounter (INDEPENDENT_AMBULATORY_CARE_PROVIDER_SITE_OTHER): Payer: Self-pay | Admitting: Bariatrics

## 2021-01-02 NOTE — Progress Notes (Signed)
Chief Complaint:   OBESITY Kara Hodge is here to discuss her progress with her obesity treatment plan along with follow-up of her obesity related diagnoses. Kara Hodge is on the Category 3 Plan and states she is following her eating plan approximately 65% of the time. Kara Hodge states she is walking and running for 60 minutes 4-5 times per week.  Today's visit was #: 2 Starting weight: 228 lbs Starting date: 12/12/2020 Today's weight: 226 lbs Today's date: 01/01/2021 Total lbs lost to date: 2 Total lbs lost since last in-office visit: 2  Interim History: Kara Hodge is down 2 lbs since her last visit. She had multiple celebrations since her last visit.  Subjective:   1. Vitamin D insufficiency Kara Hodge is currently taking multivitamins.   2. Insulin resistance Kara Hodge's A1c is 5.5 and insulin level is 11.2. She is not on medications.  3. Elevated cholesterol Kara Hodge's total cholesterol is 253 and LDL of 155.  4. Other disorder of eating Kara Hodge notes she craves savory and crunchy things.  5. At risk for heart disease Kara Hodge is at a higher than average risk for cardiovascular disease due to obesity.   Assessment/Plan:   1. Vitamin D insufficiency Low Vitamin D level contributes to fatigue and are associated with obesity, breast, and colon cancer. Kara Hodge agreed to start prescription Vitamin D 50,000 IU every week with no refills. She will follow-up for routine testing of Vitamin D, at least 2-3 times per year to avoid over-replacement.  - Vitamin D, Ergocalciferol, (DRISDOL) 1.25 MG (50000 UNIT) CAPS capsule; Take 1 capsule (50,000 Units total) by mouth every 7 (seven) days.  Dispense: 4 capsule; Refill: 0  2. Insulin resistance Kara Hodge will continue to work on weight loss, increasing exercise, and decreasing simple carbohydrates to help decrease the risk of diabetes. Kara Hodge agreed to follow-up with [place] as directed to closely monitor her progress.  3. Elevated cholesterol Cardiovascular risk and specific  lipid/LDL goals reviewed.  We discussed several lifestyle modifications today. We discussed the importance of meal planning and exercise. Kara Hodge will eliminate trans fats, decrease saturated fats, and decrease carbohydrates. Orders and follow up as documented in patient record.   Counseling Intensive lifestyle modifications are the first line treatment for this issue. Dietary changes: Increase soluble fiber. Decrease simple carbohydrates. Exercise changes: Moderate to vigorous-intensity aerobic activity 150 minutes per week if tolerated. Lipid-lowering medications: see documented in medical record.  4. Other disorder of eating Behavior modification techniques were discussed today to help Kara Hodge deal with her emotional/non-hunger eating behaviors. Kara Hodge agreed to start Wellbutrin SR 150 mg q daily with no refills. Orders and follow up as documented in patient record.   - buPROPion (WELLBUTRIN SR) 150 MG 12 hr tablet; Take 1 tablet (150 mg total) by mouth daily.  Dispense: 30 tablet; Refill: 0  5. At risk for heart disease Kara Hodge was given approximately 15 minutes of coronary artery disease prevention counseling today. She is 45 y.o. female and has risk factors for heart disease including obesity. We discussed intensive lifestyle modifications today with an emphasis on specific weight loss instructions and strategies.   Repetitive spaced learning was employed today to elicit superior memory formation and behavioral change.  6. Obesity, current BMI 35.4 Kara Hodge is currently in the action stage of change. As such, her goal is to continue with weight loss efforts. She has agreed to the Category 3 Plan.   I reviewed labs from 12/12/2020 with the patient today. Protein Equivalents handout was given.  Exercise goals: As  is.  Behavioral modification strategies: increasing lean protein intake, decreasing simple carbohydrates, increasing vegetables, increasing water intake, decreasing eating out, no skipping  meals, meal planning and cooking strategies, keeping healthy foods in the home, and planning for success.  Kara Hodge has agreed to follow-up with our clinic in 2 weeks. She was informed of the importance of frequent follow-up visits to maximize her success with intensive lifestyle modifications for her multiple health conditions.   Objective:   Blood pressure 128/89, pulse 77, temperature 97.6 F (36.4 C), height 5\' 7"  (1.702 m), weight 226 lb (102.5 kg), SpO2 96 %. Body mass index is 35.4 kg/m.  General: Cooperative, alert, well developed, in no acute distress. HEENT: Conjunctivae and lids unremarkable. Cardiovascular: Regular rhythm.  Lungs: Normal work of breathing. Neurologic: No focal deficits.   Lab Results  Component Value Date   CREATININE 0.75 12/15/2020   BUN 11 12/15/2020   NA 138 12/15/2020   K 4.4 12/15/2020   CL 104 12/15/2020   CO2 17 (L) 12/15/2020   Lab Results  Component Value Date   ALT 23 12/15/2020   AST 16 12/15/2020   ALKPHOS 70 12/15/2020   BILITOT 0.3 12/15/2020   Lab Results  Component Value Date   HGBA1C 5.5 12/15/2020   Lab Results  Component Value Date   INSULIN 11.2 12/15/2020   Lab Results  Component Value Date   TSH 3.220 12/15/2020   Lab Results  Component Value Date   CHOL 253 (H) 12/15/2020   HDL 59 12/15/2020   LDLCALC 155 (H) 12/15/2020   TRIG 214 (H) 12/15/2020   CHOLHDL 3.7 04/09/2019   Lab Results  Component Value Date   VD25OH 24.9 (L) 12/15/2020   Lab Results  Component Value Date   WBC 13.2 (H) 12/17/2010   HGB 10.5 (L) 12/17/2010   HCT 31.7 (L) 12/17/2010   MCV 89.5 12/17/2010   PLT 208 12/17/2010   No results found for: IRON, TIBC, FERRITIN  Attestation Statements:   Reviewed by clinician on day of visit: allergies, medications, problem list, medical history, surgical history, family history, social history, and previous encounter notes.   12/19/2010, am acting as Trude Mcburney for Energy manager,  DO.  I have reviewed the above documentation for accuracy and completeness, and I agree with the above. [employer], DO

## 2021-01-06 NOTE — Progress Notes (Addendum)
  Office: [phone]  /  Fax: [phone]    Date: January 20, 2021   Appointment Start Time: 11:33am Duration: 25 minutes Provider: Lawerance Cruel, Psy.D. Type of Session: Individual Therapy  Location of Patient: Home (private location) Location of Provider: Provider's Home (private office) Type of Contact: Telepsychological Visit via MyChart Video Visit  Session Content: Kara Hodge is a 45 y.o. female presenting for a follow-up appointment to address the previously established treatment goal of increasing coping skills.Today's appointment was a telepsychological visit due to COVID-19. Kara Hodge provided verbal consent for today's telepsychological appointment and she is aware she is responsible for securing confidentiality on her end of the session. Prior to proceeding with today's appointment, Kara Hodge's physical location at the time of this appointment was obtained as well a phone number she could be reached at in the event of technical difficulties. Kara Hodge and this provider participated in today's telepsychological service.   This provider conducted a brief check-in. Kara Hodge shared "things are going well" with her weight loss journey, adding she was prescribed Wellbutrin and Vitamin D. She noted an improvement in energy. Reviewed emotional and physical hunger. Psychoeducation regarding triggers for emotional eating was provided. Kara Hodge was provided a handout, and encouraged to utilize the handout between now and the next appointment to increase awareness of triggers and frequency. Kara Hodge agreed. This provider also discussed behavioral strategies for specific triggers, such as placing the utensil down when conversing to avoid mindless eating. Kara Hodge provided verbal consent during today's appointment for this provider to send a handout about triggers via e-mail. Moreover, psychoeducation regarding pleasurable activities, including its impact on emotional eating and overall well-being was provided. Kara Hodge was  provided with a handout with various options of pleasurable activities. Kara Hodge agreed. Kara Hodge provided verbal consent during today's appointment for this provider to send a handout with pleasurable activities via e-mail. Overall, Kara Hodge was receptive to today's appointment as evidenced by openness to sharing, responsiveness to feedback, and willingness to explore triggers for emotional eating and engage in pleasurable activities.   Mental Status Examination:  Appearance: well groomed and appropriate hygiene  Behavior: appropriate to circumstances Mood: euthymic Affect: mood congruent Speech: normal in rate, volume, and tone Eye Contact: appropriate Psychomotor Activity: appropriate Gait: unable to assess Thought Process: linear, logical, and goal directed  Thought Content/Perception: no hallucinations, delusions, bizarre thinking or behavior reported or observed and no evidence or endorsement of suicidal and homicidal ideation, plan, and intent Orientation: time, person, place, and purpose of appointment Memory/Concentration: memory, attention, language, and fund of knowledge intact  Insight/Judgment: fair  Interventions:  Conducted a brief chart review Provided empathic reflections and validation Reviewed content from the previous session Employed supportive psychotherapy interventions to facilitate reduced distress and to improve coping skills with identified stressors Psychoeducation provided regarding triggers for emotional eating Psychoeducation provided regarding pleasurable activities  DSM-5 Diagnosis(es):  F32.89 Other Specified Depressive Disorder, Emotional and Binge Eating Behaviors  Treatment Goal & Progress: During the initial appointment with this provider, the following treatment goal was established: increase coping skills. Progress is limited, as Kara Hodge has just begun treatment with this provider; however, she is receptive to the interaction and interventions and rapport is  being established.   Plan: Based on appointment availability and Kara Hodge's schedule, the next appointment will be scheduled in one month, which will be via MyChart Video Visit. The next session will focus on working towards the established treatment goal.

## 2021-01-20 ENCOUNTER — Telehealth (INDEPENDENT_AMBULATORY_CARE_PROVIDER_SITE_OTHER): Payer: Commercial Managed Care - PPO | Admitting: Psychology

## 2021-01-20 DIAGNOSIS — F3289 Other specified depressive episodes: Secondary | ICD-10-CM | POA: Diagnosis not present

## 2021-01-22 ENCOUNTER — Ambulatory Visit (INDEPENDENT_AMBULATORY_CARE_PROVIDER_SITE_OTHER): Payer: Commercial Managed Care - PPO | Admitting: Bariatrics

## 2021-01-22 ENCOUNTER — Other Ambulatory Visit: Payer: Self-pay

## 2021-01-22 ENCOUNTER — Encounter (INDEPENDENT_AMBULATORY_CARE_PROVIDER_SITE_OTHER): Payer: Self-pay | Admitting: Bariatrics

## 2021-01-22 VITALS — BP 116/77 | HR 76 | Temp 97.6°F | Ht 67.0 in | Wt 220.0 lb

## 2021-01-22 DIAGNOSIS — E6609 Other obesity due to excess calories: Secondary | ICD-10-CM

## 2021-01-22 DIAGNOSIS — F5089 Other specified eating disorder: Secondary | ICD-10-CM

## 2021-01-22 DIAGNOSIS — Z6835 Body mass index (BMI) 35.0-35.9, adult: Secondary | ICD-10-CM

## 2021-01-22 DIAGNOSIS — E559 Vitamin D deficiency, unspecified: Secondary | ICD-10-CM

## 2021-01-22 MED ORDER — BUPROPION HCL ER (SR) 150 MG PO TB12: 150.0000 mg | ORAL_TABLET | Freq: Every day | ORAL | 0 refills | Status: DC

## 2021-01-22 MED ORDER — VITAMIN D (ERGOCALCIFEROL) 1.25 MG (50000 UNIT) PO CAPS: 50000.0000 [IU] | ORAL_CAPSULE | ORAL | 0 refills | Status: DC

## 2021-01-22 NOTE — Progress Notes (Signed)
Chief Complaint:   OBESITY Kara Hodge is here to discuss her progress with her obesity treatment plan along with follow-up of her obesity related diagnoses. Kara Hodge is on the Category 3 Plan and states she is following her eating plan approximately 85% of the time. Kara Hodge states she is walking and weights for 60 minutes 5 times per week.  Today's visit was #: 3 Starting weight: 228 lbs Starting date: 12/12/2020 Today's weight: 220 lbs Today's date: 01/22/2021 Total lbs lost to date: 8 lbs Total lbs lost since last in-office visit: 6 lbs  Interim History: Kara Hodge is down an additional 6 lbs since her last visit. She followed the plan more closely.  Subjective:   1. Vitamin D insufficiency Kara Hodge is taking multivitamins and Vitamin D.  2. Other disorder of eating Kara Hodge is taking Wellbutrin SR 150 mg. She has decreasing cravings.   Assessment/Plan:   1. Vitamin D insufficiency Low Vitamin D level contributes to fatigue and are associated with obesity, breast, and colon cancer. We will refill prescription Vitamin D 50,000 IU every week for 1 month with no refills and Kara Hodge will follow-up for routine testing of Vitamin D, at least 2-3 times per year to avoid over-replacement.  - Vitamin D, Ergocalciferol, (DRISDOL) 1.25 MG (50000 UNIT) CAPS capsule; Take 1 capsule (50,000 Units total) by mouth every 7 (seven) days.  Dispense: 4 capsule; Refill: 0  2. Other disorder of eating Behavior modification techniques were discussed today to help Kara Hodge deal with her emotional/non-hunger eating behaviors.  We will refill Wellbutrin SR 150 mg for 1 month with no refills. Orders and follow up as documented in patient record.    - buPROPion (WELLBUTRIN SR) 150 MG 12 hr tablet; Take 1 tablet (150 mg total) by mouth daily.  Dispense: 30 tablet; Refill: 0  3. Obesity, current BMI 34.5 Kara Hodge is currently in the action stage of change. As such, her goal is to continue with weight loss efforts. She has agreed  to the Category 3 Plan.   Kara Hodge will continue meal planning and intentional eating.  Handout: Holiday supper ideas.  Exercise goals:  As is.  Behavioral modification strategies: increasing lean protein intake, decreasing simple carbohydrates, increasing vegetables, increasing water intake, decreasing eating out, no skipping meals, meal planning and cooking strategies, keeping healthy foods in the home, and planning for success.  Kara Hodge has agreed to follow-up with our clinic in 2 weeks. She was informed of the importance of frequent follow-up visits to maximize her success with intensive lifestyle modifications for her multiple health conditions.   Objective:   Blood pressure 116/77, pulse 76, temperature 97.6 F (36.4 C), height 5\' 7"  (1.702 m), weight 220 lb (99.8 kg), SpO2 97 %. Body mass index is 34.46 kg/m.  General: Cooperative, alert, well developed, in no acute distress. HEENT: Conjunctivae and lids unremarkable. Cardiovascular: Regular rhythm.  Lungs: Normal work of breathing. Neurologic: No focal deficits.   Lab Results  Component Value Date   CREATININE 0.75 12/15/2020   BUN 11 12/15/2020   NA 138 12/15/2020   K 4.4 12/15/2020   CL 104 12/15/2020   CO2 17 (L) 12/15/2020   Lab Results  Component Value Date   ALT 23 12/15/2020   AST 16 12/15/2020   ALKPHOS 70 12/15/2020   BILITOT 0.3 12/15/2020   Lab Results  Component Value Date   HGBA1C 5.5 12/15/2020   Lab Results  Component Value Date   INSULIN 11.2 12/15/2020   Lab Results  Component  Value Date   TSH 3.220 12/15/2020   Lab Results  Component Value Date   CHOL 253 (H) 12/15/2020   HDL 59 12/15/2020   LDLCALC 155 (H) 12/15/2020   TRIG 214 (H) 12/15/2020   CHOLHDL 3.7 04/09/2019   Lab Results  Component Value Date   VD25OH 24.9 (L) 12/15/2020   Lab Results  Component Value Date   WBC 13.2 (H) 12/17/2010   HGB 10.5 (L) 12/17/2010   HCT 31.7 (L) 12/17/2010   MCV 89.5 12/17/2010   PLT 208  12/17/2010   No results found for: IRON, TIBC, FERRITIN  Attestation Statements:   Reviewed by clinician on day of visit: allergies, medications, problem list, medical history, surgical history, family history, social history, and previous encounter notes.  I, Jackson Latino, RMA, am acting as Energy manager for [employer], DO.   I have reviewed the above documentation for accuracy and completeness, and I agree with the above. Corinna Capra, DO

## 2021-01-26 ENCOUNTER — Encounter (INDEPENDENT_AMBULATORY_CARE_PROVIDER_SITE_OTHER): Payer: Self-pay | Admitting: Bariatrics

## 2021-02-03 NOTE — Progress Notes (Signed)
  Office: [phone]  /  Fax: [phone]    Date: February 17, 2021   Appointment Start Time: 2:02pm Duration: 25 minutes Provider: Lawerance Cruel, Psy.D. Type of Session: Individual Therapy  Location of Patient: Home (private location) Location of Provider: Provider's Home (private office) Type of Contact: Telepsychological Visit via MyChart Video Visit  Session Content: Kara Hodge is a 45 y.o. female presenting for a follow-up appointment to address the previously established treatment goal of increasing coping skills.Today's appointment was a telepsychological visit due to COVID-19. Kara Hodge provided verbal consent for today's telepsychological appointment and she is aware she is responsible for securing confidentiality on her end of the session. Prior to proceeding with today's appointment, Kara Hodge's physical location at the time of this appointment was obtained as well a phone number she could be reached at in the event of technical difficulties. Royetta and this provider participated in today's telepsychological service.   This provider conducted a brief check-in. Kara Hodge shared, "Things have been going good. I'm down 12 pounds." She denied engagement in emotional and binge eating behaviors since the last appointment with this provider. Positive reinforcement was provided. She discussed occassionally feeling, "I could be further [referring to progress to date]." This was further explored and processed. Psychoeducation provided regarding self-compassion. Kara Hodge was engaged in a self-compassion exercise to help with eating-related challenges and other ongoing stressors.  She was encouraged to regularly ask, "What do I need right now?" Overall, Kara Hodge was receptive to today's appointment as evidenced by openness to sharing, responsiveness to feedback, and  willingness to increase self-compassion .  Mental Status Examination:  Appearance: well groomed and appropriate hygiene  Behavior: appropriate to  circumstances Mood: euthymic Affect: mood congruent Speech: normal in rate, volume, and tone Eye Contact: appropriate Psychomotor Activity: appropriate Gait: unable to assess Thought Process: linear, logical, and goal directed  Thought Content/Perception: no hallucinations, delusions, bizarre thinking or behavior reported or observed and no evidence or endorsement of suicidal and homicidal ideation, plan, and intent Orientation: time, person, place, and purpose of appointment Memory/Concentration: memory, attention, language, and fund of knowledge intact  Insight/Judgment: good  Interventions:  Conducted a brief chart review Provided empathic reflections and validation Provided positive reinforcement Employed supportive psychotherapy interventions to facilitate reduced distress and to improve coping skills with identified stressors Psychoeducation provided regarding self-compassion Engaged patient in a self-compassion exercise   DSM-5 Diagnosis(es):  F32.89 Other Specified Depressive Disorder, Emotional and Binge Eating Behaviors  Treatment Goal & Progress: During the initial appointment with this provider, the following treatment goal was established: increase coping skills. Kara Hodge has demonstrated progress in her goal as evidenced by increased awareness of hunger patterns, increased awareness of triggers for emotional eating behaviors, reduction in emotional eating behaviors , and reduction in binge eating behaviors. Kara Hodge also continues to demonstrate willingness to engage in learned skill(s).  Plan: Due to progress to date per Aleksandra's self-report, the next appointment will be scheduled in one month, which will be via MyChart Video Visit. The next session will focus on working towards the established treatment goal and possible termination . Kara Hodge reported a plan to establish care with another provider for traditional therapeutic services.

## 2021-02-10 ENCOUNTER — Ambulatory Visit (INDEPENDENT_AMBULATORY_CARE_PROVIDER_SITE_OTHER): Payer: Commercial Managed Care - PPO | Admitting: Bariatrics

## 2021-02-10 ENCOUNTER — Other Ambulatory Visit: Payer: Self-pay

## 2021-02-10 ENCOUNTER — Encounter (INDEPENDENT_AMBULATORY_CARE_PROVIDER_SITE_OTHER): Payer: Self-pay | Admitting: Bariatrics

## 2021-02-10 VITALS — BP 114/82 | HR 76 | Temp 97.5°F | Ht 67.0 in | Wt 217.0 lb

## 2021-02-10 DIAGNOSIS — Z6835 Body mass index (BMI) 35.0-35.9, adult: Secondary | ICD-10-CM | POA: Diagnosis not present

## 2021-02-10 DIAGNOSIS — F5089 Other specified eating disorder: Secondary | ICD-10-CM | POA: Diagnosis not present

## 2021-02-10 DIAGNOSIS — E559 Vitamin D deficiency, unspecified: Secondary | ICD-10-CM | POA: Diagnosis not present

## 2021-02-10 MED ORDER — VITAMIN D (ERGOCALCIFEROL) 1.25 MG (50000 UNIT) PO CAPS: 50000.0000 [IU] | ORAL_CAPSULE | ORAL | 0 refills | Status: DC

## 2021-02-10 MED ORDER — BUPROPION HCL ER (SR) 150 MG PO TB12: 150.0000 mg | ORAL_TABLET | Freq: Every day | ORAL | 0 refills | Status: DC

## 2021-02-10 NOTE — Progress Notes (Signed)
Chief Complaint:   OBESITY Kara Hodge is here to discuss her progress with her obesity treatment plan along with follow-up of her obesity related diagnoses. Kara Hodge is on the Category 3 Plan and states she is following her eating plan approximately 70% of the time. Chaeli states she is walking and strength for 60 minutes 5 times per week.  Today's visit was #: 4 Starting weight: 228 lbs Starting date: 12/12/2020 Today's weight: 217 lbs Today's date: 02/10/2021 Total lbs lost to date: 11 lbs Total lbs lost since last in-office visit: 3 lbs  Interim History: Kara Hodge is down another 3 lbs since her last visit. She is getting adequate water and protein.   Subjective:   1. Other disorder of eating Kara Hodge is currently taking Wellbutrin Sr and she states it helps with stress eating. . She states she is doing better emotional.   2. Vitamin D insufficiency Kara Hodge is taking Vitamin D as directed. She notes more energy.   Assessment/Plan:   1. Other disorder of eating Behavior modification techniques were discussed today to help Kara Hodge deal with her emotional/non-hunger eating behaviors.  We will refill Wellbutrin SR 150 mg for 1 month with no refills. Orders and follow up as documented in patient record.    - buPROPion (WELLBUTRIN SR) 150 MG 12 hr tablet; Take 1 tablet (150 mg total) by mouth daily.  Dispense: 30 tablet; Refill: 0  2. Vitamin D insufficiency Low Vitamin D level contributes to fatigue and are associated with obesity, breast, and colon cancer. We will refill prescription Vitamin D 50,000 IU every week for 1 month with no refills and Kara Hodge will follow-up for routine testing of Vitamin D, at least 2-3 times per year to avoid over-replacement.  - Vitamin D, Ergocalciferol, (DRISDOL) 1.25 MG (50000 UNIT) CAPS capsule; Take 1 capsule (50,000 Units total) by mouth every 7 (seven) days.  Dispense: 4 capsule; Refill: 0  3. Obesity BMI today is 73 Kara Hodge is currently in the action stage of  change. As such, her goal is to continue with weight loss efforts. She has agreed to the Category 3 Plan.   Kara Hodge will continue meal planning and intentional eating. Strategies for the holidays were given today.   Exercise goals:  Kara Hodge will walk and do strength 5 days a week.   Behavioral modification strategies: increasing lean protein intake, decreasing simple carbohydrates, increasing vegetables, increasing water intake, decreasing eating out, no skipping meals, meal planning and cooking strategies, keeping healthy foods in the home, and planning for success.  Kara Hodge has agreed to follow-up with our clinic in 2 weeks. She was informed of the importance of frequent follow-up visits to maximize her success with intensive lifestyle modifications for her multiple health conditions.   Objective:   Blood pressure 114/82, pulse 76, temperature (!) 97.5 F (36.4 C), height 5\' 7"  (1.702 m), weight 217 lb (98.4 kg), last menstrual period 01/10/2021, SpO2 96 %. Body mass index is 33.99 kg/m.  General: Cooperative, alert, well developed, in no acute distress. HEENT: Conjunctivae and lids unremarkable. Cardiovascular: Regular rhythm.  Lungs: Normal work of breathing. Neurologic: No focal deficits.   Lab Results  Component Value Date   CREATININE 0.75 12/15/2020   BUN 11 12/15/2020   NA 138 12/15/2020   K 4.4 12/15/2020   CL 104 12/15/2020   CO2 17 (L) 12/15/2020   Lab Results  Component Value Date   ALT 23 12/15/2020   AST 16 12/15/2020   ALKPHOS 70 12/15/2020  BILITOT 0.3 12/15/2020   Lab Results  Component Value Date   HGBA1C 5.5 12/15/2020   Lab Results  Component Value Date   INSULIN 11.2 12/15/2020   Lab Results  Component Value Date   TSH 3.220 12/15/2020   Lab Results  Component Value Date   CHOL 253 (H) 12/15/2020   HDL 59 12/15/2020   LDLCALC 155 (H) 12/15/2020   TRIG 214 (H) 12/15/2020   CHOLHDL 3.7 04/09/2019   Lab Results  Component Value Date   VD25OH  24.9 (L) 12/15/2020   Lab Results  Component Value Date   WBC 13.2 (H) 12/17/2010   HGB 10.5 (L) 12/17/2010   HCT 31.7 (L) 12/17/2010   MCV 89.5 12/17/2010   PLT 208 12/17/2010   No results found for: IRON, TIBC, FERRITIN  Attestation Statements:   Reviewed by clinician on day of visit: allergies, medications, problem list, medical history, surgical history, family history, social history, and previous encounter notes.  I, Jackson Latino, RMA, am acting as Energy manager for [employer], DO.   I have reviewed the above documentation for accuracy and completeness, and I agree with the above. Corinna Capra, DO

## 2021-02-11 ENCOUNTER — Encounter (INDEPENDENT_AMBULATORY_CARE_PROVIDER_SITE_OTHER): Payer: Self-pay | Admitting: Bariatrics

## 2021-02-17 ENCOUNTER — Telehealth (INDEPENDENT_AMBULATORY_CARE_PROVIDER_SITE_OTHER): Payer: Commercial Managed Care - PPO | Admitting: Psychology

## 2021-02-17 DIAGNOSIS — F3289 Other specified depressive episodes: Secondary | ICD-10-CM

## 2021-03-03 ENCOUNTER — Other Ambulatory Visit: Payer: Self-pay

## 2021-03-03 ENCOUNTER — Ambulatory Visit (INDEPENDENT_AMBULATORY_CARE_PROVIDER_SITE_OTHER): Payer: Commercial Managed Care - PPO | Admitting: Bariatrics

## 2021-03-03 ENCOUNTER — Encounter (INDEPENDENT_AMBULATORY_CARE_PROVIDER_SITE_OTHER): Payer: Self-pay | Admitting: Bariatrics

## 2021-03-03 VITALS — BP 136/84 | HR 79 | Temp 97.7°F | Ht 67.0 in | Wt 220.0 lb

## 2021-03-03 DIAGNOSIS — Z6835 Body mass index (BMI) 35.0-35.9, adult: Secondary | ICD-10-CM

## 2021-03-03 DIAGNOSIS — F5089 Other specified eating disorder: Secondary | ICD-10-CM | POA: Diagnosis not present

## 2021-03-03 DIAGNOSIS — E559 Vitamin D deficiency, unspecified: Secondary | ICD-10-CM | POA: Diagnosis not present

## 2021-03-03 MED ORDER — VITAMIN D (ERGOCALCIFEROL) 1.25 MG (50000 UNIT) PO CAPS: 50000.0000 [IU] | ORAL_CAPSULE | ORAL | 0 refills | Status: DC

## 2021-03-03 MED ORDER — BUPROPION HCL ER (SR) 150 MG PO TB12: 150.0000 mg | ORAL_TABLET | Freq: Every day | ORAL | 0 refills | Status: DC

## 2021-03-03 NOTE — Progress Notes (Signed)
Chief Complaint:   OBESITY Kara Hodge is here to discuss her progress with her obesity treatment plan along with follow-up of her obesity related diagnoses. Kara Hodge is on the Category 3 Plan and states she is following her eating plan approximately 50% of the time. Kara Hodge states she is walking and strength training for 60 minutes 5 times per week.  Today's visit was #: 5 Starting weight: 228 lbs Starting date: 12/12/2020 Today's weight: 220 lbs Today's date: 03/03/2021 Total lbs lost to date: 8 lbs Total lbs lost since last in-office visit: 0  Interim History: Kara Hodge is up 3 lbs over the holidays. She has went to more family events. She is doing well with her water intake.   Subjective:   1. Vitamin D insufficiency Kara Hodge is taking her medications as directed.  2. Other disorder of eating Kara Hodge's appetite is normal. She is taking her medications as directed.   Assessment/Plan:   1. Vitamin D insufficiency Low Vitamin D level contributes to fatigue and are associated with obesity, breast, and colon cancer. We will refill prescription Vitamin D 50,000 IU every week for 1 month with no refills and Angell will follow-up for routine testing of Vitamin D, at least 2-3 times per year to avoid over-replacement.  - Vitamin D, Ergocalciferol, (DRISDOL) 1.25 MG (50000 UNIT) CAPS capsule; Take 1 capsule (50,000 Units total) by mouth every 7 (seven) days.  Dispense: 4 capsule; Refill: 0  2. Other disorder of eating Behavior modification techniques were discussed today to help Kara Hodge deal with her emotional/non-hunger eating behaviors.  We will refill bupropion 150 mg for 1 month with no refills. Orders and follow up as documented in patient record.    - buPROPion (WELLBUTRIN SR) 150 MG 12 hr tablet; Take 1 tablet (150 mg total) by mouth daily.  Dispense: 30 tablet; Refill: 0  3. Obesity BMI today is 34.5 [name] is currently in the action stage of change. As such, her goal is to continue with weight  loss efforts. She has agreed to the Category 3 Plan.   Kara Hodge will continue meal planning. She will adhere closely to the plan. She will increase protein and keep her water intake high.  Exercise goals:  As is. Kara Hodge has a Rotator.  Behavioral modification strategies: increasing lean protein intake, decreasing simple carbohydrates, increasing vegetables, increasing water intake, decreasing eating out, no skipping meals, meal planning and cooking strategies, keeping healthy foods in the home, and planning for success.  Kara Hodge has agreed to follow-up with our clinic in 2-3 weeks with Kara Salvage, FNP or Kara Hamburger, NP and 6 weeks with myself. She was informed of the importance of frequent follow-up visits to maximize her success with intensive lifestyle modifications for her multiple health conditions.   Objective:   Blood pressure 136/84, pulse 79, temperature 97.7 F (36.5 C), height 5\' 7"  (1.702 m), weight 220 lb (99.8 kg), SpO2 95 %. Body mass index is 34.46 kg/m.  General: Cooperative, alert, well developed, in no acute distress. HEENT: Conjunctivae and lids unremarkable. Cardiovascular: Regular rhythm.  Lungs: Normal work of breathing. Neurologic: No focal deficits.   Lab Results  Component Value Date   CREATININE 0.75 12/15/2020   BUN 11 12/15/2020   NA 138 12/15/2020   K 4.4 12/15/2020   CL 104 12/15/2020   CO2 17 (L) 12/15/2020   Lab Results  Component Value Date   ALT 23 12/15/2020   AST 16 12/15/2020   ALKPHOS 70 12/15/2020   BILITOT 0.3  12/15/2020   Lab Results  Component Value Date   HGBA1C 5.5 12/15/2020   Lab Results  Component Value Date   INSULIN 11.2 12/15/2020   Lab Results  Component Value Date   TSH 3.220 12/15/2020   Lab Results  Component Value Date   CHOL 253 (H) 12/15/2020   HDL 59 12/15/2020   LDLCALC 155 (H) 12/15/2020   TRIG 214 (H) 12/15/2020   CHOLHDL 3.7 04/09/2019   Lab Results  Component Value Date   VD25OH 24.9 (L)  12/15/2020   Lab Results  Component Value Date   WBC 13.2 (H) 12/17/2010   HGB 10.5 (L) 12/17/2010   HCT 31.7 (L) 12/17/2010   MCV 89.5 12/17/2010   PLT 208 12/17/2010   No results found for: IRON, TIBC, FERRITIN  Attestation Statements:   Reviewed by clinician on day of visit: allergies, medications, problem list, medical history, surgical history, family history, social history, and previous encounter notes.  I, Jackson Latino, RMA, am acting as Energy manager for [employer], DO.   I have reviewed the above documentation for accuracy and completeness, and I agree with the above. Corinna Capra, DO

## 2021-03-03 NOTE — Progress Notes (Unsigned)
  Office: [phone]  /  Fax: [phone]    Date: March 27, 2021   Appointment Start Time: *** Duration: *** minutes Provider: Lawerance Cruel, Psy.D. Type of Session: Individual Therapy  Location of Patient: {gbptloc:23249} Location of Provider: Provider's Home (private office) Type of Contact: Telepsychological Visit via MyChart Video Visit  Session Content: Kara Hodge is a 45 y.o. female presenting for a follow-up appointment to address the previously established treatment goal of increasing coping skills.Today's appointment was a telepsychological visit due to COVID-19. [name] provided verbal consent for today's telepsychological appointment and she is aware she is responsible for securing confidentiality on her end of the session. Prior to proceeding with today's appointment, [name]'s physical location at the time of this appointment was obtained as well a phone number she could be reached at in the event of technical difficulties. [name] and this provider participated in today's telepsychological service.   This provider conducted a brief check-in. *** [name] was receptive to today's appointment as evidenced by openness to sharing, responsiveness to feedback, and {gbreceptiveness:23401}.  Mental Status Examination:  Appearance: {Appearance:22431} Behavior: {Behavior:22445} Mood: {gbmood:21757} Affect: {Affect:22436} Speech: {Speech:22432} Eye Contact: {Eye Contact:22433} Psychomotor Activity: {Motor Activity:22434} Gait: {gbgait:23404} Thought Process: {thought process:22448}  Thought Content/Perception: {disturbances:22451} Orientation: {Orientation:22437} Memory/Concentration: {gbcognition:22449} Insight/Judgment: {Insight:22446}  Interventions:  {Interventions for Progress Notes:23405}  DSM-5 Diagnosis(es): F32.89 Other Specified Depressive Disorder, Emotional Eating Behaviors  Treatment Goal & Progress: During the initial appointment with this provider, the following  treatment goal was established: increase coping skills. [name] has demonstrated progress in her goal as evidenced by {gbtxprogress:22839}. [name] also {gbtxprogress2:22951}.  Plan: The next appointment will be scheduled in {gbweeks:21758}, which will be {gbtxmodality:23402}. The next session will focus on {Plan for Next Appointment:23400}.

## 2021-03-17 ENCOUNTER — Telehealth (INDEPENDENT_AMBULATORY_CARE_PROVIDER_SITE_OTHER): Payer: Commercial Managed Care - PPO | Admitting: Psychology

## 2021-03-19 ENCOUNTER — Other Ambulatory Visit: Payer: Self-pay

## 2021-03-19 ENCOUNTER — Telehealth (INDEPENDENT_AMBULATORY_CARE_PROVIDER_SITE_OTHER): Payer: Commercial Managed Care - PPO | Admitting: Adult Health

## 2021-03-19 ENCOUNTER — Encounter (INDEPENDENT_AMBULATORY_CARE_PROVIDER_SITE_OTHER): Payer: Self-pay | Admitting: Adult Health

## 2021-03-19 DIAGNOSIS — F5089 Other specified eating disorder: Secondary | ICD-10-CM

## 2021-03-19 DIAGNOSIS — E559 Vitamin D deficiency, unspecified: Secondary | ICD-10-CM | POA: Diagnosis not present

## 2021-03-19 DIAGNOSIS — Z6835 Body mass index (BMI) 35.0-35.9, adult: Secondary | ICD-10-CM

## 2021-03-19 MED ORDER — BUPROPION HCL ER (SR) 150 MG PO TB12
150.0000 mg | ORAL_TABLET | Freq: Every day | ORAL | 0 refills | Status: DC
Start: 2021-03-19 — End: 2021-04-15

## 2021-03-19 MED ORDER — VITAMIN D (ERGOCALCIFEROL) 1.25 MG (50000 UNIT) PO CAPS: 50000.0000 [IU] | ORAL_CAPSULE | ORAL | 0 refills | Status: DC

## 2021-03-19 NOTE — Progress Notes (Signed)
TeleHealth Visit:  Due to the COVID-19 pandemic, this visit was completed with telemedicine (audio/video) technology to reduce patient and provider exposure as well as to preserve personal protective equipment.   Kara Hodge has verbally consented to this TeleHealth visit. The patient is located at home, the provider is located at the [facility] office. The participants in this visit include the listed provider and patient. The visit was conducted today via MyChart video.  Chief Complaint: OBESITY Kara Hodge is here to discuss her progress with her obesity treatment plan along with follow-up of her obesity related diagnoses. Kara Hodge is on the Category 3 Plan and states she is following her eating plan approximately 50% of the time. Kara Hodge states she is walking for 60 minutes 5 times per week.  Today's visit was #: 6 Starting weight: 228 lbs Starting date: 12/12/2020  Interim History:  Annalena's husband was recently hospitalized for 2 days last week for a syncopal event. He is currently wearing a Holter Monitor and stable at home. She feels that she was exposed at the hospital and is now exhibiting  - pharyngitis, nonproductive cough, body aches. Negative home antigen COVID-19 test.   Denies fever or dyspnea.  Subjective:   1. Vitamin D insufficiency Vitamin D level on 12/15/2020 - 24.9 - well below goal of 50. She is currently taking prescription ergocalciferol 50,000 IU each week. She denies nausea, vomiting or muscle weakness.  2. Other disorder of eating Epic review - BP has been:  SBP 130, 110, 110; DBP 80, 80, 70. She is on bupropion SR 150 mg daily - reports well controlled cravings.  Denies SI/HI.  Assessment/Plan:   1. Vitamin D insufficiency Refill ergocalciferol 50,000 IU once weekly.  - Refill Vitamin D, Ergocalciferol, (DRISDOL) 1.25 MG (50000 UNIT) CAPS capsule; Take 1 capsule (50,000 Units total) by mouth every 7 (seven) days.  Dispense: 4 capsule; Refill: 0  2.  Other disorder of eating Refill bupropion SR 150 mg daily, as per below.  - Refill buPROPion (WELLBUTRIN SR) 150 MG 12 hr tablet; Take 1 tablet (150 mg total) by mouth daily.  Dispense: 30 tablet; Refill: 0  3. Obesity BMI today is 34.5  Kara Hodge is currently in the action stage of change. As such, her goal is to continue with weight loss efforts. She has agreed to the Category 3 Plan.   Increase hydration, increase rest.  Remain home until acute symptoms resolve.  Exercise goals: No exercise has been prescribed at this time.  Behavioral modification strategies: increasing lean protein intake, decreasing simple carbohydrates, meal planning and cooking strategies, keeping healthy foods in the home, and planning for success.  Kara Hodge has agreed to follow-up with our clinic in 3 weeks. She was informed of the importance of frequent follow-up visits to maximize her success with intensive lifestyle modifications for her multiple health conditions.  Objective:   VITALS: Per patient if applicable, see vitals. GENERAL: Alert and in no acute distress. CARDIOPULMONARY: No increased WOB. Speaking in clear sentences.  PSYCH: Pleasant and cooperative. Speech normal rate and rhythm. Affect is appropriate. Insight and judgement are appropriate. Attention is focused, linear, and appropriate.  NEURO: Oriented as arrived to appointment on time with no prompting.   Lab Results  Component Value Date   CREATININE 0.75 12/15/2020   BUN 11 12/15/2020   NA 138 12/15/2020   K 4.4 12/15/2020   CL 104 12/15/2020   CO2 17 (L) 12/15/2020   Lab Results  Component Value Date  ALT 23 12/15/2020   AST 16 12/15/2020   ALKPHOS 70 12/15/2020   BILITOT 0.3 12/15/2020   Lab Results  Component Value Date   HGBA1C 5.5 12/15/2020   Lab Results  Component Value Date   INSULIN 11.2 12/15/2020   Lab Results  Component Value Date   TSH 3.220 12/15/2020   Lab Results  Component Value Date   CHOL 253 (H)  12/15/2020   HDL 59 12/15/2020   LDLCALC 155 (H) 12/15/2020   TRIG 214 (H) 12/15/2020   CHOLHDL 3.7 04/09/2019   Lab Results  Component Value Date   VD25OH 24.9 (L) 12/15/2020   Lab Results  Component Value Date   WBC 13.2 (H) 12/17/2010   HGB 10.5 (L) 12/17/2010   HCT 31.7 (L) 12/17/2010   MCV 89.5 12/17/2010   PLT 208 12/17/2010   Attestation Statements:   Reviewed by clinician on day of visit: allergies, medications, problem list, medical history, surgical history, family history, social history, and previous encounter notes.  Time spent on visit including pre-visit chart review and post-visit charting and care was 28 minutes.   I, Insurance claims handler, CMA, am acting as Energy manager for William Hamburger, NP.  I have reviewed the above documentation for accuracy and completeness, and I agree with the above. - Woodley Petzold d. Carlyle Mcelrath, NP-C

## 2021-03-21 NOTE — Progress Notes (Incomplete)
°  Office: [phone]  /  Fax: [phone]    Date: March 23, 2021   Appointment Start Time: *** Duration: *** minutes Provider: Lawerance Cruel, Psy.D. Type of Session: Individual Therapy  Location of Patient: {gbptloc:23249} Location of Provider: Provider's Home (private office) Type of Contact: Telepsychological Visit via MyChart Video Visit  Session Content: This provider called [name] at 12:33pm as she did not present for today's appointment. A HIPAA compliant voicemail was left requesting a call back.  As such, today's appointment was initiated *** minutes late. Kara Hodge is a 45 y.o. female presenting for a follow-up appointment to address the previously established treatment goal of increasing coping skills.Today's appointment was a telepsychological visit due to COVID-19. [name] provided verbal consent for today's telepsychological appointment and she is aware she is responsible for securing confidentiality on her end of the session. Prior to proceeding with today's appointment, [name]'s physical location at the time of this appointment was obtained as well a phone number she could be reached at in the event of technical difficulties. [name] and this provider participated in today's telepsychological service.   This provider conducted a brief check-in. *** [name] was receptive to today's appointment as evidenced by openness to sharing, responsiveness to feedback, and {gbreceptiveness:23401}.  Mental Status Examination:  Appearance: {Appearance:22431} Behavior: {Behavior:22445} Mood: {gbmood:21757} Affect: {Affect:22436} Speech: {Speech:22432} Eye Contact: {Eye Contact:22433} Psychomotor Activity: {Motor Activity:22434} Gait: {gbgait:23404} Thought Process: {thought process:22448}  Thought Content/Perception: {disturbances:22451} Orientation: {Orientation:22437} Memory/Concentration: {gbcognition:22449} Insight/Judgment: {Insight:22446}  Interventions:  {Interventions for Progress  Notes:23405}  DSM-5 Diagnosis(es): F32.89 Other Specified Depressive Disorder, Emotional and Binge Eating Behaviors  Treatment Goal & Progress: During the initial appointment with this provider, the following treatment goal was established: increase coping skills. [name] has demonstrated progress in her goal as evidenced by {gbtxprogress:22839}. [name] also {gbtxprogress2:22951}.  Plan: The next appointment will be scheduled in {gbweeks:21758}, which will be {gbtxmodality:23402}. The next session will focus on {Plan for Next Appointment:23400}.

## 2021-03-23 ENCOUNTER — Encounter (INDEPENDENT_AMBULATORY_CARE_PROVIDER_SITE_OTHER): Payer: Self-pay

## 2021-03-23 ENCOUNTER — Telehealth (INDEPENDENT_AMBULATORY_CARE_PROVIDER_SITE_OTHER): Payer: Self-pay | Admitting: Psychology

## 2021-03-23 ENCOUNTER — Telehealth (INDEPENDENT_AMBULATORY_CARE_PROVIDER_SITE_OTHER): Payer: Commercial Managed Care - PPO | Admitting: Psychology

## 2021-03-23 NOTE — Telephone Encounter (Signed)
°  Office: [phone]  /  Fax: [phone]  Date of Call: March 23, 2021  Time of Call: 12:33pm Provider: Lawerance Cruel, PsyD  CONTENT: This provider called Kara Hodge to check-in as she did not present for today's MyChart Video Visit appointment at 12:30pm. A HIPAA compliant voicemail was left requesting a call back. Of note, this provider stayed on the MyChart Video Visit appointment for 5 minutes prior to signing off per the clinic's grace period policy.    PLAN: This provider will wait for Kara Hodge to call back. No further follow-up planned by this provider.

## 2021-04-15 ENCOUNTER — Other Ambulatory Visit: Payer: Self-pay

## 2021-04-15 ENCOUNTER — Encounter (INDEPENDENT_AMBULATORY_CARE_PROVIDER_SITE_OTHER): Payer: Self-pay | Admitting: Bariatrics

## 2021-04-15 ENCOUNTER — Ambulatory Visit (INDEPENDENT_AMBULATORY_CARE_PROVIDER_SITE_OTHER): Payer: Commercial Managed Care - PPO | Admitting: Bariatrics

## 2021-04-15 VITALS — BP 144/79 | HR 75 | Temp 97.5°F | Ht 67.0 in | Wt 223.0 lb

## 2021-04-15 DIAGNOSIS — E6609 Other obesity due to excess calories: Secondary | ICD-10-CM | POA: Diagnosis not present

## 2021-04-15 DIAGNOSIS — Z6835 Body mass index (BMI) 35.0-35.9, adult: Secondary | ICD-10-CM | POA: Diagnosis not present

## 2021-04-15 DIAGNOSIS — F5089 Other specified eating disorder: Secondary | ICD-10-CM | POA: Diagnosis not present

## 2021-04-15 DIAGNOSIS — E559 Vitamin D deficiency, unspecified: Secondary | ICD-10-CM | POA: Diagnosis not present

## 2021-04-15 MED ORDER — BUPROPION HCL ER (SR) 150 MG PO TB12: 150.0000 mg | ORAL_TABLET | Freq: Every day | ORAL | 0 refills | Status: DC

## 2021-04-15 MED ORDER — VITAMIN D (ERGOCALCIFEROL) 1.25 MG (50000 UNIT) PO CAPS: 50000.0000 [IU] | ORAL_CAPSULE | ORAL | 0 refills | Status: DC

## 2021-04-15 NOTE — Progress Notes (Signed)
Chief Complaint:   OBESITY Kara Hodge is here to discuss her progress with her obesity treatment plan along with follow-up of her obesity related diagnoses. Kara Hodge is on the Category 3 Plan and states she is following her eating plan approximately 50% of the time. Kara Hodge states she is walking for 30 minutes 5 times per week and strength for 30 minutes 3 times per week.  Today's visit was #: 7 Starting weight: 228 lbs Starting date: 12/12/2020 Today's weight: 223 lbs Today's date: 04/15/2021 Total lbs lost to date: 5 lbs Total lbs lost since last in-office visit: 0  Interim History: Kara Hodge is up 3 lbs over the holidays. She thinks that she is getting adequate water.   Subjective:   1. Vitamin D insufficiency Kara Hodge is taking Vitamin D as directed.   2. Other disorder of eating Kara Hodge states Wellbutrin helps with stress eating.   Assessment/Plan:   1. Vitamin D insufficiency Low Vitamin D level contributes to fatigue and are associated with obesity, breast, and colon cancer. We will refill prescription Vitamin D 50,000 IU every week for 1 month with no refills and Janney will follow-up for routine testing of Vitamin D, at least 2-3 times per year to avoid over-replacement.  - Vitamin D, Ergocalciferol, (DRISDOL) 1.25 MG (50000 UNIT) CAPS capsule; Take 1 capsule (50,000 Units total) by mouth every 7 (seven) days.  Dispense: 4 capsule; Refill: 0  2. Other disorder of eating We will refill Wellbutrin SR 150 mg daily for 1 month with no refills. Behavior modification techniques were discussed today to help Kara Hodge deal with her emotional/non-hunger eating behaviors.  Orders and follow up as documented in patient record.   - buPROPion (WELLBUTRIN SR) 150 MG 12 hr tablet; Take 1 tablet (150 mg total) by mouth daily.  Dispense: 30 tablet; Refill: 0  3. Obesity, current BMI 35.1 Kennis is currently in the action stage of change. As such, her goal is to continue with weight loss efforts. She has  agreed to the Category 3 Plan.   Kara Hodge will adhere more closely to the plan 80-90%. She will get back on track. She will increase protein.  Exercise goals:  As is.  Behavioral modification strategies: increasing lean protein intake, decreasing simple carbohydrates, increasing vegetables, increasing water intake, decreasing eating out, no skipping meals, meal planning and cooking strategies, keeping healthy foods in the home, and planning for success.  Kara Hodge has agreed to follow-up with our clinic in 3 weeks (fasting). She was informed of the importance of frequent follow-up visits to maximize her success with intensive lifestyle modifications for her multiple health conditions.   Objective:   Blood pressure (!) 144/79, pulse 75, temperature (!) 97.5 F (36.4 C), height 5\' 7"  (1.702 m), weight 223 lb (101.2 kg), SpO2 97 %. Body mass index is 34.93 kg/m.  General: Cooperative, alert, well developed, in no acute distress. HEENT: Conjunctivae and lids unremarkable. Cardiovascular: Regular rhythm.  Lungs: Normal work of breathing. Neurologic: No focal deficits.   Lab Results  Component Value Date   CREATININE 0.75 12/15/2020   BUN 11 12/15/2020   NA 138 12/15/2020   K 4.4 12/15/2020   CL 104 12/15/2020   CO2 17 (L) 12/15/2020   Lab Results  Component Value Date   ALT 23 12/15/2020   AST 16 12/15/2020   ALKPHOS 70 12/15/2020   BILITOT 0.3 12/15/2020   Lab Results  Component Value Date   HGBA1C 5.5 12/15/2020   Lab Results  Component Value  Date   INSULIN 11.2 12/15/2020   Lab Results  Component Value Date   TSH 3.220 12/15/2020   Lab Results  Component Value Date   CHOL 253 (H) 12/15/2020   HDL 59 12/15/2020   LDLCALC 155 (H) 12/15/2020   TRIG 214 (H) 12/15/2020   CHOLHDL 3.7 04/09/2019   Lab Results  Component Value Date   VD25OH 24.9 (L) 12/15/2020   Lab Results  Component Value Date   WBC 13.2 (H) 12/17/2010   HGB 10.5 (L) 12/17/2010   HCT 31.7 (L)  12/17/2010   MCV 89.5 12/17/2010   PLT 208 12/17/2010   No results found for: IRON, TIBC, FERRITIN  Attestation Statements:   Reviewed by clinician on day of visit: allergies, medications, problem list, medical history, surgical history, family history, social history, and previous encounter notes.  I, Jackson Latino, RMA, am acting as Energy manager for [employer], DO.  I have reviewed the above documentation for accuracy and completeness, and I agree with the above. Corinna Capra, DO

## 2021-04-19 ENCOUNTER — Encounter (INDEPENDENT_AMBULATORY_CARE_PROVIDER_SITE_OTHER): Payer: Self-pay | Admitting: Bariatrics

## 2021-05-11 ENCOUNTER — Encounter (INDEPENDENT_AMBULATORY_CARE_PROVIDER_SITE_OTHER): Payer: Self-pay | Admitting: Bariatrics

## 2021-05-11 ENCOUNTER — Other Ambulatory Visit: Payer: Self-pay

## 2021-05-11 ENCOUNTER — Ambulatory Visit (INDEPENDENT_AMBULATORY_CARE_PROVIDER_SITE_OTHER): Payer: Commercial Managed Care - PPO | Admitting: Bariatrics

## 2021-05-11 VITALS — BP 119/82 | HR 82 | Temp 97.7°F | Ht 67.0 in | Wt 219.0 lb

## 2021-05-11 DIAGNOSIS — E6609 Other obesity due to excess calories: Secondary | ICD-10-CM

## 2021-05-11 DIAGNOSIS — Z6834 Body mass index (BMI) 34.0-34.9, adult: Secondary | ICD-10-CM

## 2021-05-11 DIAGNOSIS — F5089 Other specified eating disorder: Secondary | ICD-10-CM

## 2021-05-11 DIAGNOSIS — E669 Obesity, unspecified: Secondary | ICD-10-CM

## 2021-05-11 DIAGNOSIS — E8881 Metabolic syndrome: Secondary | ICD-10-CM | POA: Diagnosis not present

## 2021-05-11 DIAGNOSIS — E559 Vitamin D deficiency, unspecified: Secondary | ICD-10-CM

## 2021-05-11 DIAGNOSIS — E7800 Pure hypercholesterolemia, unspecified: Secondary | ICD-10-CM | POA: Diagnosis not present

## 2021-05-11 MED ORDER — VITAMIN D (ERGOCALCIFEROL) 1.25 MG (50000 UNIT) PO CAPS: 50000.0000 [IU] | ORAL_CAPSULE | ORAL | 0 refills | Status: DC

## 2021-05-11 MED ORDER — BUPROPION HCL ER (SR) 150 MG PO TB12: 150.0000 mg | ORAL_TABLET | Freq: Every day | ORAL | 0 refills | Status: DC

## 2021-05-11 NOTE — Progress Notes (Signed)
Chief Complaint:   OBESITY Kara Hodge is here to discuss her progress with her obesity treatment plan along with follow-up of her obesity related diagnoses. Kara Hodge is on the Category 3 Plan and states she is following her eating plan approximately 75% of the time. Kara Hodge states she is doing cardio and strength training for 60 minutes 5 times per week.  Today's visit was #: 8 Starting weight: 228 lbs Starting date: 12/12/2020 Today's weight: 219 lbs Today's date: 05/11/2021 Total lbs lost to date: 9 lbs Total lbs lost since last in-office visit: 4 lbs  Interim History: Kara Hodge is down an additional 4 lbs since her last visit.  Subjective:   1. Vitamin D insufficiency Kara Hodge is taking her Vitamin D as directed. She notes minimum sun exposure.  2. Elevated cholesterol Kara Hodge is not on medications currently.  3. Insulin resistance Berna is currently not on medications.  4. Other disorder of eating Kara Hodge is taking her medications as directed. She denies side effects.   Assessment/Plan:   1. Vitamin D insufficiency Low Vitamin D level contributes to fatigue and are associated with obesity, breast, and colon cancer. We will refill prescription Vitamin D 50,000 IU every week for 1 month with no refills and Kara Hodge will follow-up for routine testing of Vitamin D, at least 2-3 times per year to avoid over-replacement. We will check Vitamin D today.   - Vitamin D, Ergocalciferol, (DRISDOL) 1.25 MG (50000 UNIT) CAPS capsule; Take 1 capsule (50,000 Units total) by mouth every 7 (seven) days.  Dispense: 4 capsule; Refill: 0 - VITAMIN D 25 Hydroxy (Vit-D Deficiency, Fractures)  2. Elevated cholesterol Cardiovascular risk and specific lipid/LDL goals reviewed.  We will check Lipids and CMP today.We discussed several lifestyle modifications today and Kara Hodge will continue to work on diet, exercise and weight loss efforts. Orders and follow up as documented in patient record.   Counseling Intensive  lifestyle modifications are the first line treatment for this issue. Dietary changes: Increase soluble fiber. Decrease simple carbohydrates. Exercise changes: Moderate to vigorous-intensity aerobic activity 150 minutes per week if tolerated. Lipid-lowering medications: see documented in medical record.  - Lipid Panel With LDL/HDL Ratio - Comprehensive metabolic panel  3. Insulin resistance We will check Insulin today. Kara Hodge will continue to work on weight loss, exercise, and decreasing simple carbohydrates to help decrease the risk of diabetes. Kara Hodge agreed to follow-up with [place] as directed to closely monitor her progress.  - Comprehensive metabolic panel - Insulin, random  4. Other disorder of eating We will refill Wellbutrin SR 150 mg for 1 month with no refills. Behavior modification techniques were discussed today to help Kara Hodge deal with her emotional/non-hunger eating behaviors.  Orders and follow up as documented in patient record.    - buPROPion (WELLBUTRIN SR) 150 MG 12 hr tablet; Take 1 tablet (150 mg total) by mouth daily.  Dispense: 30 tablet; Refill: 0  5. Obesity, current BMI 34.4 Kara Hodge is currently in the action stage of change. As such, her goal is to continue with weight loss efforts. She has agreed to the Category 3 Plan.   Kara Hodge will continue meal planning and she will continue intentional eating.  Exercise goals:  As is.  Behavioral modification strategies: increasing lean protein intake, decreasing simple carbohydrates, increasing vegetables, increasing water intake, decreasing eating out, no skipping meals, meal planning and cooking strategies, keeping healthy foods in the home, and planning for success.  Kara Hodge has agreed to follow-up with our clinic in 3 weeks.  She was informed of the importance of frequent follow-up visits to maximize her success with intensive lifestyle modifications for her multiple health conditions.   Objective:   Blood pressure 119/82, pulse  82, temperature 97.7 F (36.5 C), height 5\' 7"  (1.702 m), weight 219 lb (99.3 kg), SpO2 97 %. Body mass index is 34.3 kg/m.  General: Cooperative, alert, well developed, in no acute distress. HEENT: Conjunctivae and lids unremarkable. Cardiovascular: Regular rhythm.  Lungs: Normal work of breathing. Neurologic: No focal deficits.   Lab Results  Component Value Date   CREATININE 0.75 12/15/2020   BUN 11 12/15/2020   NA 138 12/15/2020   K 4.4 12/15/2020   CL 104 12/15/2020   CO2 17 (L) 12/15/2020   Lab Results  Component Value Date   ALT 23 12/15/2020   AST 16 12/15/2020   ALKPHOS 70 12/15/2020   BILITOT 0.3 12/15/2020   Lab Results  Component Value Date   HGBA1C 5.5 12/15/2020   Lab Results  Component Value Date   INSULIN 11.2 12/15/2020   Lab Results  Component Value Date   TSH 3.220 12/15/2020   Lab Results  Component Value Date   CHOL 253 (H) 12/15/2020   HDL 59 12/15/2020   LDLCALC 155 (H) 12/15/2020   TRIG 214 (H) 12/15/2020   CHOLHDL 3.7 04/09/2019   Lab Results  Component Value Date   VD25OH 24.9 (L) 12/15/2020   Lab Results  Component Value Date   WBC 13.2 (H) 12/17/2010   HGB 10.5 (L) 12/17/2010   HCT 31.7 (L) 12/17/2010   MCV 89.5 12/17/2010   PLT 208 12/17/2010   No results found for: IRON, TIBC, FERRITIN  Attestation Statements:   Reviewed by clinician on day of visit: allergies, medications, problem list, medical history, surgical history, family history, social history, and previous encounter notes.  I, 12/19/2010, RMA, am acting as Jackson Latino for Energy manager, DO.  I have reviewed the above documentation for accuracy and completeness, and I agree with the above. [employer], DO

## 2021-05-12 ENCOUNTER — Encounter (INDEPENDENT_AMBULATORY_CARE_PROVIDER_SITE_OTHER): Payer: Self-pay | Admitting: Bariatrics

## 2021-05-12 LAB — LIPID PANEL WITH LDL/HDL RATIO
Cholesterol, Total: 156 mg/dL (ref 100–199)
HDL: 53 mg/dL (ref >39)
LDL Chol Calc (NIH): 86 mg/dL (ref 0–99)
LDL/HDL Ratio: 1.6 ratio (ref 0.0–3.2)
Triglycerides: 88 mg/dL (ref 0–149)
VLDL Cholesterol Cal: 17 mg/dL (ref 5–40)

## 2021-05-12 LAB — COMPREHENSIVE METABOLIC PANEL
ALT: 14 IU/L (ref 0–32)
AST: 17 IU/L (ref 0–40)
Albumin/Globulin Ratio: 2.1 (ref 1.2–2.2)
Albumin: 4.4 g/dL (ref 3.8–4.8)
Alkaline Phosphatase: 62 IU/L (ref 44–121)
BUN/Creatinine Ratio: 16 (ref 9–23)
BUN: 13 mg/dL (ref 6–24)
Bilirubin Total: 0.2 mg/dL (ref 0.0–1.2)
CO2: 21 mmol/L (ref 20–29)
Calcium: 9.1 mg/dL (ref 8.7–10.2)
Chloride: 105 mmol/L (ref 96–106)
Creatinine, Ser: 0.79 mg/dL (ref 0.57–1.00)
Globulin, Total: 2.1 g/dL (ref 1.5–4.5)
Glucose: 95 mg/dL (ref 70–99)
Potassium: 4.7 mmol/L (ref 3.5–5.2)
Sodium: 139 mmol/L (ref 134–144)
Total Protein: 6.5 g/dL (ref 6.0–8.5)
eGFR: 95 mL/min/{1.73_m2} (ref >59)

## 2021-05-12 LAB — VITAMIN D 25 HYDROXY (VIT D DEFICIENCY, FRACTURES): Vit D, 25-Hydroxy: 45.9 ng/mL (ref 30.0–100.0)

## 2021-05-12 LAB — INSULIN, RANDOM: INSULIN: 4.7 u[IU]/mL (ref 2.6–24.9)

## 2021-05-25 ENCOUNTER — Encounter (INDEPENDENT_AMBULATORY_CARE_PROVIDER_SITE_OTHER): Payer: Self-pay | Admitting: Bariatrics

## 2021-05-25 ENCOUNTER — Other Ambulatory Visit: Payer: Self-pay

## 2021-05-25 ENCOUNTER — Ambulatory Visit (INDEPENDENT_AMBULATORY_CARE_PROVIDER_SITE_OTHER): Payer: Commercial Managed Care - PPO | Admitting: Bariatrics

## 2021-05-25 VITALS — BP 131/83 | HR 81 | Temp 97.9°F | Ht 67.0 in | Wt 218.0 lb

## 2021-05-25 DIAGNOSIS — F5089 Other specified eating disorder: Secondary | ICD-10-CM

## 2021-05-25 DIAGNOSIS — E559 Vitamin D deficiency, unspecified: Secondary | ICD-10-CM | POA: Diagnosis not present

## 2021-05-25 DIAGNOSIS — Z6834 Body mass index (BMI) 34.0-34.9, adult: Secondary | ICD-10-CM | POA: Diagnosis not present

## 2021-05-25 DIAGNOSIS — E6609 Other obesity due to excess calories: Secondary | ICD-10-CM | POA: Diagnosis not present

## 2021-05-25 DIAGNOSIS — E66812 Obesity, class 2: Secondary | ICD-10-CM

## 2021-05-25 MED ORDER — VITAMIN D (ERGOCALCIFEROL) 1.25 MG (50000 UNIT) PO CAPS: 50000.0000 [IU] | ORAL_CAPSULE | ORAL | 0 refills | Status: DC

## 2021-05-25 MED ORDER — BUPROPION HCL ER (SR) 150 MG PO TB12: 150.0000 mg | ORAL_TABLET | Freq: Every day | ORAL | 0 refills | Status: DC

## 2021-05-25 NOTE — Progress Notes (Signed)
Chief Complaint:   OBESITY Kara Hodge is here to discuss her progress with her obesity treatment plan along with follow-up of her obesity related diagnoses. Kara Hodge is on the Category 3 Plan and states she is following her eating plan approximately 80% of the time. Kara Hodge states she is doing cardio for 30-60 minutes 5 times per week.  Today's visit was #: 8 Starting weight: 228 lbs Starting date: 12/12/2020 Today's weight: 218 lbs Today's date: 05/25/2021 Total lbs lost to date: 10 Total lbs lost since last in-office visit: 1  Interim History: Kara Hodge is down 1 lb since our last visit.  Subjective:   1. Vitamin D insufficiency Kara Hodge is taking Vit D as directed, (energy is ok).  2. Other disorder of eating Kara Hodge is taking Wellbutrin SR.  Assessment/Plan:   1. Vitamin D insufficiency Low Vitamin D level contributes to fatigue and are associated with obesity, breast, and colon cancer. We will refill prescription Vitamin D for 1 month. Kara Hodge will follow-up for routine testing of Vitamin D, at least 2-3 times per year to avoid over-replacement.  - Vitamin D, Ergocalciferol, (DRISDOL) 1.25 MG (50000 UNIT) CAPS capsule; Take 1 capsule (50,000 Units total) by mouth every 7 (seven) days.  Dispense: 4 capsule; Refill: 0  2. Other disorder of eating Behavior modification techniques were discussed today to help Kara Hodge deal with her emotional/non-hunger eating behaviors. We will refill Wellbutrin SR for 1 month. Orders and follow up as documented in patient record.   - buPROPion (WELLBUTRIN SR) 150 MG 12 hr tablet; Take 1 tablet (150 mg total) by mouth daily.  Dispense: 30 tablet; Refill: 0  3. Obesity, current BMI 34.2 Kara Hodge is currently in the action stage of change. As such, her goal is to continue with weight loss efforts. She has agreed to the Category 3 Plan.   Reviewed labs: 05/11/2021 CMP, lipid, Vit D, glucose, and insulin.  Exercise goals: As is, and increase resistance  training.  Behavioral modification strategies: increasing lean protein intake, decreasing simple carbohydrates, increasing vegetables, increasing water intake, decreasing eating out, no skipping meals, meal planning and cooking strategies, keeping healthy foods in the home, and planning for success.  Kara Hodge has agreed to follow-up with our clinic in 3 weeks. She was informed of the importance of frequent follow-up visits to maximize her success with intensive lifestyle modifications for her multiple health conditions.   Objective:   Blood pressure 131/83, pulse 81, temperature 97.9 F (36.6 C), height 5\' 7"  (1.702 m), weight 218 lb (98.9 kg), last menstrual period 05/25/2021, SpO2 94 %. Body mass index is 34.14 kg/m.  General: Cooperative, alert, well developed, in no acute distress. HEENT: Conjunctivae and lids unremarkable. Cardiovascular: Regular rhythm.  Lungs: Normal work of breathing. Neurologic: No focal deficits.   Lab Results  Component Value Date   CREATININE 0.79 05/11/2021   BUN 13 05/11/2021   NA 139 05/11/2021   K 4.7 05/11/2021   CL 105 05/11/2021   CO2 21 05/11/2021   Lab Results  Component Value Date   ALT 14 05/11/2021   AST 17 05/11/2021   ALKPHOS 62 05/11/2021   BILITOT 0.2 05/11/2021   Lab Results  Component Value Date   HGBA1C 5.5 12/15/2020   Lab Results  Component Value Date   INSULIN 4.7 05/11/2021   INSULIN 11.2 12/15/2020   Lab Results  Component Value Date   TSH 3.220 12/15/2020   Lab Results  Component Value Date   CHOL 156 05/11/2021  HDL 53 05/11/2021   LDLCALC 86 05/11/2021   TRIG 88 05/11/2021   CHOLHDL 3.7 04/09/2019   Lab Results  Component Value Date   VD25OH 45.9 05/11/2021   VD25OH 24.9 (L) 12/15/2020   Lab Results  Component Value Date   WBC 13.2 (H) 12/17/2010   HGB 10.5 (L) 12/17/2010   HCT 31.7 (L) 12/17/2010   MCV 89.5 12/17/2010   PLT 208 12/17/2010   No results found for: IRON, TIBC,  FERRITIN  Attestation Statements:   Reviewed by clinician on day of visit: allergies, medications, problem list, medical history, surgical history, family history, social history, and previous encounter notes.   Trude Mcburney, am acting as Energy manager for [employer], DO.  I have reviewed the above documentation for accuracy and completeness, and I agree with the above. Corinna Capra, DO

## 2021-05-26 ENCOUNTER — Encounter (INDEPENDENT_AMBULATORY_CARE_PROVIDER_SITE_OTHER): Payer: Self-pay | Admitting: Bariatrics

## 2021-06-08 ENCOUNTER — Ambulatory Visit (INDEPENDENT_AMBULATORY_CARE_PROVIDER_SITE_OTHER): Payer: Commercial Managed Care - PPO | Admitting: Bariatrics

## 2021-06-15 ENCOUNTER — Ambulatory Visit (INDEPENDENT_AMBULATORY_CARE_PROVIDER_SITE_OTHER): Payer: Commercial Managed Care - PPO | Admitting: Bariatrics

## 2021-06-15 ENCOUNTER — Other Ambulatory Visit: Payer: Self-pay

## 2021-06-15 ENCOUNTER — Encounter (INDEPENDENT_AMBULATORY_CARE_PROVIDER_SITE_OTHER): Payer: Self-pay | Admitting: Bariatrics

## 2021-06-15 VITALS — BP 119/75 | HR 86 | Temp 98.0°F | Ht 67.0 in | Wt 218.0 lb

## 2021-06-15 DIAGNOSIS — F5089 Other specified eating disorder: Secondary | ICD-10-CM | POA: Diagnosis not present

## 2021-06-15 DIAGNOSIS — E559 Vitamin D deficiency, unspecified: Secondary | ICD-10-CM | POA: Diagnosis not present

## 2021-06-15 DIAGNOSIS — Z6834 Body mass index (BMI) 34.0-34.9, adult: Secondary | ICD-10-CM | POA: Diagnosis not present

## 2021-06-15 DIAGNOSIS — E669 Obesity, unspecified: Secondary | ICD-10-CM | POA: Diagnosis not present

## 2021-06-15 DIAGNOSIS — E6609 Other obesity due to excess calories: Secondary | ICD-10-CM

## 2021-06-15 MED ORDER — VITAMIN D (ERGOCALCIFEROL) 1.25 MG (50000 UNIT) PO CAPS: 50000.0000 [IU] | ORAL_CAPSULE | ORAL | 0 refills | Status: AC

## 2021-06-15 MED ORDER — BUPROPION HCL ER (SR) 150 MG PO TB12: 150.0000 mg | ORAL_TABLET | Freq: Every day | ORAL | 0 refills | Status: AC

## 2021-06-16 NOTE — Progress Notes (Signed)
? ? ? ?Chief Complaint:  ? ?OBESITY ?Kara Hodge is here to discuss her progress with her obesity treatment plan along with follow-up of her obesity related diagnoses. Kara Hodge is on the Category 3 Plan and states she is following her eating plan approximately 60% of the time. Kara Hodge states she is walking for 30 minutes 7 times per week and yoga for 20 minutes 3-5 times per week. ? ?Today's visit was #: 9 ?Starting weight: 228 lbs ?Starting date: 12/12/2020 ?Today's weight: 218 lbs ?Today's date: 06/15/2021 ?Total lbs lost to date: 10 lbs ?Total lbs lost since last in-office visit: 0 ? ?Interim History: Kara Hodge's weight remains the same. She went to see her mother and was eating differently. She is getting her water and protein. ? ?Subjective:  ? ?1. Vitamin D insufficiency ?Kara Hodge is currently taking Vitamin D.  ? ?2. Other disorder of eating ?Kara Hodge states Wellbutrin helps with emotional eating.  ? ?Assessment/Plan:  ? ?1. Vitamin D insufficiency ?Low Vitamin D level contributes to fatigue and are associated with obesity, breast, and colon cancer. We will refill prescription Vitamin D 50,000 IU every week for 1 month with no refills and Kara Hodge will follow-up for routine testing of Vitamin D, at least 2-3 times per year to avoid over-replacement. ? ?- Vitamin D, Ergocalciferol, (DRISDOL) 1.25 MG (50000 UNIT) CAPS capsule; Take 1 capsule (50,000 Units total) by mouth every 7 (seven) days.  Dispense: 4 capsule; Refill: 0 ? ?2. Other disorder of eating ?We will refill Wellbutrin SR 150 mg for 1 month with no refills. Behavior modification techniques were discussed today to help Kara Hodge deal with her emotional/non-hunger eating behaviors.  Orders and follow up as documented in patient record.  ? ?- buPROPion (WELLBUTRIN SR) 150 MG 12 hr tablet; Take 1 tablet (150 mg total) by mouth daily.  Dispense: 30 tablet; Refill: 0 ? ?3. Obesity, current BMI 34.3 ?Kara Hodge is currently in the action stage of change. As such, her goal is to continue  with weight loss efforts. She has agreed to the Category 3 Plan.  ? ?Kara Hodge will continue meal planning and she will continue intentional eating.  ? ?Exercise goals:  As is. ? ?Behavioral modification strategies: increasing lean protein intake, decreasing simple carbohydrates, increasing vegetables, increasing water intake, decreasing eating out, no skipping meals, meal planning and cooking strategies, keeping healthy foods in the home, and planning for success. ? ?Kara Hodge has agreed to follow-up with our clinic in 3 weeks. She was informed of the importance of frequent follow-up visits to maximize her success with intensive lifestyle modifications for her multiple health conditions.  ? ?Objective:  ? ?Blood pressure 119/75, pulse 86, temperature 98 ?F (36.7 ?C), height 5\' 7"  (1.702 m), weight 218 lb (98.9 kg), last menstrual period 05/25/2021, SpO2 95 %. ?Body mass index is 34.14 kg/m?. ? ?General: Cooperative, alert, well developed, in no acute distress. ?HEENT: Conjunctivae and lids unremarkable. ?Cardiovascular: Regular rhythm.  ?Lungs: Normal work of breathing. ?Neurologic: No focal deficits.  ? ?Lab Results  ?Component Value Date  ? CREATININE 0.79 05/11/2021  ? BUN 13 05/11/2021  ? NA 139 05/11/2021  ? K 4.7 05/11/2021  ? CL 105 05/11/2021  ? CO2 21 05/11/2021  ? ?Lab Results  ?Component Value Date  ? ALT 14 05/11/2021  ? AST 17 05/11/2021  ? ALKPHOS 62 05/11/2021  ? BILITOT 0.2 05/11/2021  ? ?Lab Results  ?Component Value Date  ? HGBA1C 5.5 12/15/2020  ? ?Lab Results  ?Component Value Date  ? INSULIN  4.7 05/11/2021  ? INSULIN 11.2 12/15/2020  ? ?Lab Results  ?Component Value Date  ? TSH 3.220 12/15/2020  ? ?Lab Results  ?Component Value Date  ? CHOL 156 05/11/2021  ? HDL 53 05/11/2021  ? LDLCALC 86 05/11/2021  ? TRIG 88 05/11/2021  ? CHOLHDL 3.7 04/09/2019  ? ?Lab Results  ?Component Value Date  ? VD25OH 45.9 05/11/2021  ? VD25OH 24.9 (L) 12/15/2020  ? ?Lab Results  ?Component Value Date  ? WBC 13.2 (H)  12/17/2010  ? HGB 10.5 (L) 12/17/2010  ? HCT 31.7 (L) 12/17/2010  ? MCV 89.5 12/17/2010  ? PLT 208 12/17/2010  ? ?No results found for: IRON, TIBC, FERRITIN ? ?Attestation Statements:  ? ?Reviewed by clinician on day of visit: allergies, medications, problem list, medical history, surgical history, family history, social history, and previous encounter notes. ? ?I, Jackson Latino, RMA, am acting as transcriptionist for [employer], DO. ? ?I have reviewed the above documentation for accuracy and completeness, and I agree with the above. Corinna Capra, DO ? ?

## 2021-06-17 ENCOUNTER — Encounter (INDEPENDENT_AMBULATORY_CARE_PROVIDER_SITE_OTHER): Payer: Self-pay | Admitting: Bariatrics

## 2021-07-10 ENCOUNTER — Encounter (INDEPENDENT_AMBULATORY_CARE_PROVIDER_SITE_OTHER): Payer: Self-pay | Admitting: Bariatrics

## 2021-07-13 ENCOUNTER — Ambulatory Visit (INDEPENDENT_AMBULATORY_CARE_PROVIDER_SITE_OTHER): Payer: Commercial Managed Care - PPO | Admitting: Bariatrics

## 2021-11-11 ENCOUNTER — Encounter (INDEPENDENT_AMBULATORY_CARE_PROVIDER_SITE_OTHER): Payer: Self-pay
